# Patient Record
Sex: Male | Born: 1960 | Race: White | Hispanic: No | Marital: Married | Smoking: Never smoker
Health system: Southern US, Community
[De-identification: ages and names within clinical notes are randomized; demographics above are authoritative.]

## PROBLEM LIST (undated history)

## (undated) DIAGNOSIS — I219 Acute myocardial infarction, unspecified: Secondary | ICD-10-CM

## (undated) DIAGNOSIS — E119 Type 2 diabetes mellitus without complications: Secondary | ICD-10-CM

## (undated) DIAGNOSIS — I2510 Atherosclerotic heart disease of native coronary artery without angina pectoris: Secondary | ICD-10-CM

## (undated) HISTORY — PX: APPENDECTOMY: SHX54

## (undated) HISTORY — PX: CORONARY STENT PLACEMENT: SHX1402

## (undated) HISTORY — DX: Atherosclerotic heart disease of native coronary artery without angina pectoris: I25.10

## (undated) HISTORY — PX: BACK SURGERY: SHX140

## (undated) HISTORY — PX: SINUS SURGERY WITH INSTATRAK: SHX5215

## (undated) HISTORY — PX: CARDIAC CATHETERIZATION: SHX172

## (undated) HISTORY — PX: CORONARY ANGIOPLASTY: SHX604

---

## 2002-05-13 ENCOUNTER — Encounter: Admission: RE | Admit: 2002-05-13 | Discharge: 2002-08-11 | Payer: Self-pay | Admitting: Family Medicine

## 2017-03-30 DIAGNOSIS — Z955 Presence of coronary angioplasty implant and graft: Secondary | ICD-10-CM | POA: Insufficient documentation

## 2017-03-31 ENCOUNTER — Encounter: Payer: Self-pay | Admitting: Cardiology

## 2017-03-31 DIAGNOSIS — E119 Type 2 diabetes mellitus without complications: Secondary | ICD-10-CM

## 2017-03-31 DIAGNOSIS — E7800 Pure hypercholesterolemia, unspecified: Secondary | ICD-10-CM

## 2017-03-31 DIAGNOSIS — I2510 Atherosclerotic heart disease of native coronary artery without angina pectoris: Secondary | ICD-10-CM

## 2017-03-31 DIAGNOSIS — I119 Hypertensive heart disease without heart failure: Secondary | ICD-10-CM

## 2017-08-22 ENCOUNTER — Observation Stay (HOSPITAL_COMMUNITY)
Admission: EM | Admit: 2017-08-22 | Discharge: 2017-08-24 | Disposition: A | Discharge status: Home / Self Care | Payer: BLUE CROSS/BLUE SHIELD | Attending: Cardiology | Admitting: Cardiology

## 2017-08-22 ENCOUNTER — Other Ambulatory Visit: Payer: Self-pay

## 2017-08-22 ENCOUNTER — Emergency Department (HOSPITAL_COMMUNITY): Payer: BLUE CROSS/BLUE SHIELD

## 2017-08-22 ENCOUNTER — Encounter: Payer: Self-pay | Admitting: Cardiology

## 2017-08-22 ENCOUNTER — Encounter (HOSPITAL_COMMUNITY): Payer: Self-pay | Admitting: Nurse Practitioner

## 2017-08-22 DIAGNOSIS — Z6834 Body mass index (BMI) 34.0-34.9, adult: Secondary | ICD-10-CM | POA: Insufficient documentation

## 2017-08-22 DIAGNOSIS — R079 Chest pain, unspecified: Secondary | ICD-10-CM | POA: Diagnosis present

## 2017-08-22 DIAGNOSIS — I119 Hypertensive heart disease without heart failure: Secondary | ICD-10-CM | POA: Insufficient documentation

## 2017-08-22 DIAGNOSIS — E785 Hyperlipidemia, unspecified: Secondary | ICD-10-CM | POA: Diagnosis not present

## 2017-08-22 DIAGNOSIS — I25110 Atherosclerotic heart disease of native coronary artery with unstable angina pectoris: Principal | ICD-10-CM | POA: Insufficient documentation

## 2017-08-22 DIAGNOSIS — I200 Unstable angina: Secondary | ICD-10-CM | POA: Diagnosis present

## 2017-08-22 DIAGNOSIS — Z7984 Long term (current) use of oral hypoglycemic drugs: Secondary | ICD-10-CM | POA: Insufficient documentation

## 2017-08-22 DIAGNOSIS — Z955 Presence of coronary angioplasty implant and graft: Secondary | ICD-10-CM | POA: Diagnosis not present

## 2017-08-22 DIAGNOSIS — Z7982 Long term (current) use of aspirin: Secondary | ICD-10-CM | POA: Diagnosis not present

## 2017-08-22 DIAGNOSIS — I252 Old myocardial infarction: Secondary | ICD-10-CM | POA: Diagnosis not present

## 2017-08-22 DIAGNOSIS — E782 Mixed hyperlipidemia: Secondary | ICD-10-CM | POA: Insufficient documentation

## 2017-08-22 DIAGNOSIS — I2510 Atherosclerotic heart disease of native coronary artery without angina pectoris: Secondary | ICD-10-CM | POA: Insufficient documentation

## 2017-08-22 DIAGNOSIS — E119 Type 2 diabetes mellitus without complications: Secondary | ICD-10-CM | POA: Insufficient documentation

## 2017-08-22 DIAGNOSIS — Z794 Long term (current) use of insulin: Secondary | ICD-10-CM | POA: Insufficient documentation

## 2017-08-22 DIAGNOSIS — E669 Obesity, unspecified: Secondary | ICD-10-CM

## 2017-08-22 HISTORY — DX: Acute myocardial infarction, unspecified: I21.9

## 2017-08-22 HISTORY — DX: Type 2 diabetes mellitus without complications: E11.9

## 2017-08-22 LAB — BASIC METABOLIC PANEL
Anion gap: 8 (ref 5–15)
BUN: 16 mg/dL (ref 6–20)
CO2: 24 mmol/L (ref 22–32)
Calcium: 9.6 mg/dL (ref 8.9–10.3)
Chloride: 107 mmol/L (ref 101–111)
Creatinine, Ser: 0.95 mg/dL (ref 0.61–1.24)
GFR calc Af Amer: >60 mL/min (ref >60)
GFR calc non Af Amer: >60 mL/min (ref >60)
Glucose, Bld: 124 mg/dL — ABNORMAL HIGH (ref 65–99)
Potassium: 4 mmol/L (ref 3.5–5.1)
Sodium: 139 mmol/L (ref 135–145)

## 2017-08-22 LAB — I-STAT TROPONIN, ED
Troponin i, poc: 0.03 ng/mL (ref 0.00–0.08)
Troponin i, poc: 0 ng/mL (ref 0.00–0.08)

## 2017-08-22 LAB — CBC
HCT: 40.1 % (ref 39.0–52.0)
Hemoglobin: 12.9 g/dL — ABNORMAL LOW (ref 13.0–17.0)
MCH: 31.2 pg (ref 26.0–34.0)
MCHC: 32.2 g/dL (ref 30.0–36.0)
MCV: 97.1 fL (ref 78.0–100.0)
Platelets: 288 10*3/uL (ref 150–400)
RBC: 4.13 MIL/uL — ABNORMAL LOW (ref 4.22–5.81)
RDW: 13.8 % (ref 11.5–15.5)
WBC: 9 10*3/uL (ref 4.0–10.5)

## 2017-08-22 MED ORDER — ACETAMINOPHEN 325 MG PO TABS
650.0000 mg | ORAL_TABLET | ORAL | Status: DC | PRN
  Administered 2017-08-23: 650 mg via ORAL
  Filled 2017-08-22: qty 2

## 2017-08-22 MED ORDER — LORATADINE 10 MG PO TABS
10.0000 mg | ORAL_TABLET | Freq: Every day | ORAL | Status: DC
  Administered 2017-08-23 – 2017-08-24 (×2): 10 mg via ORAL
  Filled 2017-08-22 (×2): qty 1

## 2017-08-22 MED ORDER — ASPIRIN EC 81 MG PO TBEC
81.0000 mg | DELAYED_RELEASE_TABLET | Freq: Every day | ORAL | Status: DC
  Administered 2017-08-23 – 2017-08-24 (×2): 81 mg via ORAL
  Filled 2017-08-22 (×2): qty 1

## 2017-08-22 MED ORDER — LOSARTAN POTASSIUM 25 MG PO TABS
25.0000 mg | ORAL_TABLET | Freq: Every day | ORAL | Status: DC
  Administered 2017-08-23 – 2017-08-24 (×2): 25 mg via ORAL
  Filled 2017-08-22 (×2): qty 1

## 2017-08-22 MED ORDER — ONDANSETRON HCL 4 MG/2ML IJ SOLN: 4.0000 mg | Freq: Four times a day (QID) | INTRAMUSCULAR | Status: DC | PRN

## 2017-08-22 MED ORDER — VITAMIN D 1000 UNITS PO TABS
2000.0000 [IU] | ORAL_TABLET | Freq: Two times a day (BID) | ORAL | Status: DC
  Administered 2017-08-23 – 2017-08-24 (×3): 2000 [IU] via ORAL
  Filled 2017-08-22 (×3): qty 2

## 2017-08-22 MED ORDER — VITAMIN C 500 MG PO TABS
1000.0000 mg | ORAL_TABLET | Freq: Every day | ORAL | Status: DC
  Administered 2017-08-23 – 2017-08-24 (×2): 1000 mg via ORAL
  Filled 2017-08-22 (×2): qty 2

## 2017-08-22 MED ORDER — ADULT MULTIVITAMIN W/MINERALS CH
1.0000 | ORAL_TABLET | Freq: Every day | ORAL | Status: DC
  Administered 2017-08-23 – 2017-08-24 (×2): 1 via ORAL
  Filled 2017-08-22 (×2): qty 1

## 2017-08-22 NOTE — ED Provider Notes (Signed)
[facility]-EMERGENCY DEPT Provider Note   CSN: [id number] Arrival date & time: 08/22/17  1726     History   Chief Complaint Chief Complaint  Patient presents with  . Chest Pain    HPI Benjamin Riley is a 56 y.o. male with a history of MI, diabetes, who presents today for evaluation of chest pain. He reports that this chest pain beginning this afternoon when he was at his desk. It was a sharp pain that was 9 out of 10 and has left anterior chest. He reports that while he was having the pain he was extremely diaphoretic, and felt like he was going to pass out but did not. He denies any recent fevers, or cough. Denies any recent long travel. He reports that when he had his previous MI his only symptom was a headache and he did not have chest pain.  His cardiologist is Dr.Tilley.    His previous MI was in [place] in 2012.  HPI  Past Medical History:  Diagnosis Date  . Diabetes mellitus without complication (HCC)   . Myocardial infarct [facility])     Patient Active Problem List   Diagnosis Date Noted  . CAD (coronary artery disease), native coronary artery 08/22/2017  . Diabetes mellitus type 2, noninsulin dependent (HCC) 08/22/2017  . Hyperlipidemia 08/22/2017  . Hypertensive heart disease without CHF 08/22/2017  . Unstable angina (HCC) 08/22/2017  . Status post coronary artery stent placement 03/30/2017    Past Surgical History:  Procedure Laterality Date  . BACK SURGERY    . CORONARY STENT PLACEMENT         Home Medications    Prior to Admission medications   Medication Sig Start Date End Date Taking? Authorizing Provider  Ascorbic Acid (VITAMIN C) 1000 MG tablet Take 1,000 mg by mouth daily.   Yes [provider]  aspirin EC 81 MG tablet Take 81 mg by mouth daily.   Yes [provider]  Cholecalciferol (VITAMIN D3) 2000 units capsule Take 2,000 Units by mouth 2 (two) times daily.   Yes [provider]  glipiZIDE (GLUCOTROL) 10 MG tablet Take 10 mg  by mouth 2 (two) times daily. 07/17/17  Yes [provider]  JARDIANCE 10 MG TABS tablet Take 10 mg by mouth daily. 07/10/17  Yes [provider]  LIVALO 2 MG TABS Take 2 mg by mouth daily. 07/10/17  Yes [provider]  loratadine (CLARITIN) 10 MG tablet Take 10 mg by mouth daily.   Yes [provider]  losartan (COZAAR) 25 MG tablet Take 25 mg by mouth daily. 07/20/17  Yes [provider]  metFORMIN (GLUCOPHAGE-XR) 500 MG 24 hr tablet Take 500 mg by mouth 2 (two) times daily. 07/17/17  Yes [provider]  Multiple Vitamin (MULTI-VITAMINS) TABS Take 1 tablet by mouth daily.   Yes [provider]  pioglitazone (ACTOS) 30 MG tablet Take 30 mg by mouth daily. 07/17/17  Yes [provider]    Family History No family history on file.  Social History Social History  Substance Use Topics  . Smoking status: Never Smoker  . Smokeless tobacco: Never Used  . Alcohol use No     Allergies   Statins   Review of Systems Review of Systems  Constitutional: Positive for diaphoresis. Negative for chills and fever.  HENT: Negative for ear pain and sore throat.   Eyes: Negative for pain and visual disturbance.  Respiratory: Positive for shortness of breath. Negative for cough.   Cardiovascular:  Positive for chest pain. Negative for palpitations.  Gastrointestinal: Negative for abdominal pain, nausea and vomiting.  Genitourinary: Negative for dysuria and hematuria.  Musculoskeletal: Negative for arthralgias and back pain.  Skin: Negative for color change and rash.  Neurological: Positive for light-headedness. Negative for seizures, syncope and headaches.  All other systems reviewed and are negative.  All positives except mild headache were present during CP episode, have resolved.   Physical Exam Updated Vital Signs BP 139/75   Pulse (!) 50   Temp 98.3 F (36.8 C) (Oral)   Resp 14   SpO2 97%   Physical Exam    Constitutional: He appears well-developed and well-nourished.  HENT:  Head: Normocephalic and atraumatic.  Eyes: Conjunctivae are normal.  Neck: Neck supple. No JVD present.  Cardiovascular: Normal rate, regular rhythm, normal heart sounds and intact distal pulses.   No murmur heard. Pulmonary/Chest: Effort normal and breath sounds normal. No respiratory distress.  Abdominal: Soft. Bowel sounds are normal. He exhibits no distension. There is no tenderness.  Musculoskeletal: He exhibits no edema.  Neurological: He is alert.  Skin: Skin is warm and dry. He is not diaphoretic.  Psychiatric: He has a normal mood and affect. His behavior is normal.  Nursing note and vitals reviewed.    ED Treatments / Results  Labs (all labs ordered are listed, but only abnormal results are displayed) Labs Reviewed  BASIC METABOLIC PANEL - Abnormal; Notable for the following:       Result Value   Glucose, Bld 124 (*)    All other components within normal limits  CBC - Abnormal; Notable for the following:    RBC 4.13 (*)    Hemoglobin 12.9 (*)    All other components within normal limits  HIV ANTIBODY (ROUTINE TESTING)  TROPONIN I  CBC  BASIC METABOLIC PANEL  I-STAT TROPONIN, ED  I-STAT TROPONIN, ED    EKG  EKG Interpretation  Date/Time:  Tuesday August 22 2017 21:31:28 EDT Ventricular Rate:  45 PR Interval:  152 QRS Duration: 106 QT Interval:  482 QTC Calculation: 417 R Axis:   -12 Text Interpretation:  Sinus bradycardia Left ventricular hypertrophy Confirmed by Benjiman CorePickering, Nathan [phone]([id number]) on 08/22/2017 10:09:45 PM       Radiology Dg Chest 2 View  Result Date: 08/22/2017 CLINICAL DATA:  Chest pain beginning this afternoon while at rest. EXAM: CHEST  2 VIEW COMPARISON:  None. FINDINGS: The heart size and mediastinal contours are within normal limits. Both lungs are clear. Mild degenerative change along the upper and midthoracic spine. IMPRESSION: No active cardiopulmonary disease.  Electronically Signed   By: Tollie Ethavid  Kwon M.D.   On: 08/22/2017 18:42    Procedures Procedures (including critical care time)  Medications Ordered in ED Medications     Initial Impression / Assessment and Plan / ED Course  I have reviewed the triage vital signs and the nursing notes.  Pertinent labs & imaging results that were available during my care of the patient were reviewed by me and considered in my medical decision making (see chart for details).  Clinical Course as of Aug 24 19  Tue Aug 22, 2017  2240 Spoke with cardiology who states that they will admit the patient.  [EH]    Clinical Course User Index [EH] Cristina GongHammond, Cheryle Dark W, PA-C   Concern for cardiac etiology of Chest Pain. Cardiology has been consulted and will see patient in the ED for likely admit. Pt does not meet criteria for CP protocol and a  further evaluation is recommended. Pt has been re-evaluated prior to consult and VSS, NAD, heart RRR, pain 0/10, lungs CTAB. No acute abnormalities found on EKG and first round of cardiac enzymes negative. This case was discussed with Dr. Rubin Payor who has seen the patient and agrees with plan to admit.    Final Clinical Impressions(s) / ED Diagnoses   Final diagnoses:  Chest pain, unspecified type    New Prescriptions New Prescriptions   No medications on file     Norman Clay 08/23/17 Lloyd Huger, MD 08/23/17 0028

## 2017-08-22 NOTE — ED Triage Notes (Signed)
Pt presents with c/o chest pain. The pain began this afternoon while he was sitting at rest. The pain was a sharp pain in his left upper chest that has decreased now. He reports lightheadedness, diaphoresis. He denies syncope, fevers, shortness of breath, cough. He has history of MI six years ago and this feels similar.

## 2017-08-22 NOTE — Progress Notes (Unsigned)
Benjamin Riley, Benjamin Riley    Date of visit:  03/31/2017 DOB:  1961/03/17    Age:  56 yrs. Medical record number:  [id number]     Account number:  [id number] ____________________________ CURRENT DIAGNOSES  1. CAD Native without angina  2. Hypertensive heart disease without heart failure  3. Hyperlipidemia  4. Murmur  5. Presence of coronary stent  6. Type 2 diabetes mellitus without complications  7. Obesity ____________________________ ALLERGIES  Hmg-Coa Reductase Inhibitors, Muscle aches  Lisinopril, Intolerance-cough  Penicillins, Rash ____________________________ MEDICATIONS  1. Aspirin Childrens 81 mg chewable tablet, 1 p.o. daily  2. glipizide 10 mg tablet, BID  3. Januvia 50 mg tablet, 1 p.o. daily  4. loratadine 10 mg tablet, 1 p.o. daily  5. losartan 25 mg tablet, 1 p.o. daily  6. metformin 500 mg tablet, 2 p.o. b.i.d.  7. Zetia 10 mg tablet, 1 p.o. daily ____________________________ CHIEF COMPLAINTS  coronary artery disease ____________________________ HISTORY OF PRESENT ILLNESS This very nice 56 year old male is seen to establish for cardiac care. [place] He has a prior history of hypertension diabetes and hyperlipidemia. He has known statin intolerance. He had some symptoms of dyspnea and vague tightness and had a stent in his LAD placed in [place] in 2012. He had a negative myocardial perfusion scan in June of 2016. He normally takes fairly good care of himself and has established for diabetic care with Dr. Leslie DalesAltheimer. He is obese and has really not been able to lose any significant weight. He does not get much exercise. He does have a known cardiac murmur and at some point may of had an echocardiogram in the past. He doesn't really get a lot in the way of regular exercise. He has no diabetic complications. He denies PND orthopnea or edema.  ____________________________ PAST HISTORY  Past Medical Illnesses:  hypertension,  DM-non-insulin dependent, hyperlipidemia, obesity;  Cardiovascular Illnesses:  CAD, murmur, LVH;  Infectious Diseases:  no previous history of significant infectious diseases;  Surgical Procedures:  appendectomy, lumbar laminectomy, shoulder repair;  Trauma History:  no previous history of significant trauma;  NYHA Classification:  I;  Canadian Angina Classification:  Class 0: Asymptomatic;  Cardiology Procedures-Invasive:  cardiac cath (left) March 2012, 3.5x 15 mm Xience stent to LAD [facility]24/[facility];  Cardiology Procedures-Noninvasive:  treadmill Myoview 2016;  Peripheral Vascular Procedures:  no previous invasive peripheral vascular procedures.;  LVEF of 60% documented via echocardiogram on 05/19/2015,   ____________________________ CARDIO-PULMONARY TEST DATES EKG Date:  03/31/2017;   Cardiac Cath Date:  02/21/2011;  Stent Placement Date: 02/21/2011;  Nuclear Study Date:  02/22/2012;  Echocardiography Date: 05/19/2015;   ____________________________ FAMILY HISTORY Brother -- Coronary Artery Disease, Brother alive with problem Brother -- Brother alive and well Father -- Father alive with problem, Heart disease Mother -- Diabetes mellitus, Cancer ___________________________ SOCIAL HISTORY Alcohol Use:  does not use alcohol;  Smoking:  never smoked;  Diet:  regular diet;  Lifestyle:  married;  Exercise:  exercises regularly;  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 Eyes: denies diplopia, history of glaucoma or visual problems. Ears, Nose, Throat, Mouth:  denies any hearing loss, epistaxis, hoarseness or difficulty speaking. Respiratory: denies dyspnea, cough, wheezing or hemoptysis. Cardiovascular:  please review HPI Abdominal: occasional diarrhea  Genitourinary-Male: no dysuria, urgency, frequency, or nocturia  Musculoskeletal:  chronic low back pain Neurological:   denies  headaches, stroke, or TIA Psychiatric:  denies depression or anxiety Endocrine: denies any history of weight change, heat/cold intolerance, polydipsia, or polyuria Hematological/Immunologic:  denies any food allergies, bleeding disorders. ____________________________ PHYSICAL EXAMINATION VITAL SIGNS  Blood Pressure:  130/70 Sitting, Left arm, large cuff  , 124/72 Standing, Left arm and large cuff   Pulse:  59/min. Weight:  246.00 lbs. Height:  70"BMI: 35  Constitutional:  pleasant white male in no acute distress, moderately obese Skin:  warm and dry to touch, no apparent skin lesions, or masses noted. Head:  normocephalic, normal hair pattern, no masses or tenderness Eyes:  EOMS Intact, PERRLA, C and S clear, Funduscopic exam not done. ENT:  ears, nose and throat reveal no gross abnormalities.  Dentition good. Neck:  supple, without massess. No JVD, thyromegaly or carotid bruits. Carotid upstroke normal. Chest:  normal symmetry, clear to auscultation. Cardiac:  regular rhythm, normal S1 and S2, No S3 or S4, no murmurs, gallops or rubs detected. Abdomen:  abdomen soft,non-tender, no masses, no hepatospenomegaly, or aneurysm noted Peripheral Pulses:  the femoral,dorsalis pedis, and posterior tibial pulses are full and equal bilaterally with no bruits auscultated. Extremities & Back:  no deformities, clubbing, cyanosis, erythema or edema observed. Normal muscle strength and tone. Neurological:  no gross motor or sensory deficits noted, affect appropriate, oriented x3. ____________________________ IMPRESSIONS/PLAN  1. CAD with previous stenting of the LAD with no angina 2. Hypertensive heart disease controlled 3. Obesity with the need to lose weight discussed with patient 4. Hyperlipidemia with statin intolerance 5. Type 2 diabetes without complications currently managed medically  Recommendations:   Discuss cardiac prevention with the patient. Discussed need for weight reduction  and regular exercise. His previous cardiologist mentioned changing to the PCSK9 inhibitor's. His endocrinologist has started him on Livalo but I doubt that this will get him to where he needs to be for his cardiac status. He has been intolerant to all of the other statins and I believe if his endocrinologist does not start the PCSK9 inhibitors I have asked him to contact us if his lipids do not come under control. Otherwise I will see him in followup in one year. 12 EKG personally reviewed by me shows sinus rhythm and LVH by voltage.  ____________________________ TODAYS ORDERS  1. 12 Lead EKG: 1 year  2. 12 Lead EKG: Today  3. Return Visit: 1 year                       ____________________________ Cardiology Physician:  W. Spencer Tehilla Coffel, Jr. MD FACC    

## 2017-08-22 NOTE — Progress Notes (Signed)
Benjamin Riley, [name]    Date of visit:  03/31/2017 DOB:  1961/03/17    Age:  56 yrs. Medical record number:  [id number]     Account number:  [id number] ____________________________ CURRENT DIAGNOSES  1. CAD Native without angina  2. Hypertensive heart disease without heart failure  3. Hyperlipidemia  4. Murmur  5. Presence of coronary stent  6. Type 2 diabetes mellitus without complications  7. Obesity ____________________________ ALLERGIES  Hmg-Coa Reductase Inhibitors, Muscle aches  Lisinopril, Intolerance-cough  Penicillins, Rash ____________________________ MEDICATIONS  1. Aspirin Childrens 81 mg chewable tablet, 1 p.o. daily  2. glipizide 10 mg tablet, BID  3. Januvia 50 mg tablet, 1 p.o. daily  4. loratadine 10 mg tablet, 1 p.o. daily  5. losartan 25 mg tablet, 1 p.o. daily  6. metformin 500 mg tablet, 2 p.o. b.i.d.  7. Zetia 10 mg tablet, 1 p.o. daily ____________________________ CHIEF COMPLAINTS  coronary artery disease ____________________________ HISTORY OF PRESENT ILLNESS This very nice 56 year old male is seen to establish for cardiac care. The patient recently [place] [facility]. He has a prior history of hypertension diabetes and hyperlipidemia. He has known statin intolerance. He had some symptoms of dyspnea and vague tightness and had a stent in his LAD placed in [place] in [place]. He had a negative myocardial perfusion scan in June of 2016. He normally takes fairly good care of himself and has established for diabetic care with Dr. Leslie DalesAltheimer. He is obese and has really not been able to lose any significant weight. He does not get much exercise. He does have a known cardiac murmur and at some point may of had an echocardiogram in the past. He doesn't really get a lot in the way of regular exercise. He has no diabetic complications. He denies PND orthopnea or edema.  ____________________________ PAST HISTORY  Past Medical Illnesses:  hypertension,  DM-non-insulin dependent, hyperlipidemia, obesity;  Cardiovascular Illnesses:  CAD, murmur, LVH;  Infectious Diseases:  no previous history of significant infectious diseases;  Surgical Procedures:  appendectomy, lumbar laminectomy, shoulder repair;  Trauma History:  no previous history of significant trauma;  NYHA Classification:  I;  Canadian Angina Classification:  Class 0: Asymptomatic;  Cardiology Procedures-Invasive:  cardiac cath (left) March 2012, 3.5x 15 mm Xience stent to LAD 02/24/11 [facility];  Cardiology Procedures-Noninvasive:  treadmill Myoview 2016;  Peripheral Vascular Procedures:  no previous invasive peripheral vascular procedures.;  LVEF of 60% documented via echocardiogram on 05/19/2015,   ____________________________ CARDIO-PULMONARY TEST DATES EKG Date:  03/31/2017;   Cardiac Cath Date:  02/21/2011;  Stent Placement Date: 02/21/2011;  Nuclear Study Date:  02/22/2012;  Echocardiography Date: 05/19/2015;   ____________________________ FAMILY HISTORY Brother -- Coronary Artery Disease, Brother alive with problem Brother -- Brother alive and well Father -- Father alive with problem, Heart disease Mother -- Diabetes mellitus, Cancer ___________________________ SOCIAL HISTORY Alcohol Use:  does not use alcohol;  Smoking:  never smoked;  Diet:  regular diet;  Lifestyle:  married;  Exercise:  exercises regularly;  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 Eyes: denies diplopia, history of glaucoma or visual problems. Ears, Nose, Throat, Mouth:  denies any hearing loss, epistaxis, hoarseness or difficulty speaking. Respiratory: denies dyspnea, cough, wheezing or hemoptysis. Cardiovascular:  please review HPI Abdominal: occasional diarrhea  Genitourinary-Male: no dysuria, urgency, frequency, or nocturia  Musculoskeletal:  chronic low back pain Neurological:   denies  headaches, stroke, or TIA Psychiatric:  denies depression or anxiety Endocrine: denies any history of weight change, heat/cold intolerance, polydipsia, or polyuria Hematological/Immunologic:  denies any food allergies, bleeding disorders. ____________________________ PHYSICAL EXAMINATION VITAL SIGNS  Blood Pressure:  130/70 Sitting, Left arm, large cuff  , 124/72 Standing, Left arm and large cuff   Pulse:  59/min. Weight:  246.00 lbs. Height:  70"BMI: 35  Constitutional:  pleasant white male in no acute distress, moderately obese Skin:  warm and dry to touch, no apparent skin lesions, or masses noted. Head:  normocephalic, normal hair pattern, no masses or tenderness Eyes:  EOMS Intact, PERRLA, C and S clear, Funduscopic exam not done. ENT:  ears, nose and throat reveal no gross abnormalities.  Dentition good. Neck:  supple, without massess. No JVD, thyromegaly or carotid bruits. Carotid upstroke normal. Chest:  normal symmetry, clear to auscultation. Cardiac:  regular rhythm, normal S1 and S2, No S3 or S4, no murmurs, gallops or rubs detected. Abdomen:  abdomen soft,non-tender, no masses, no hepatospenomegaly, or aneurysm noted Peripheral Pulses:  the femoral,dorsalis pedis, and posterior tibial pulses are full and equal bilaterally with no bruits auscultated. Extremities & Back:  no deformities, clubbing, cyanosis, erythema or edema observed. Normal muscle strength and tone. Neurological:  no gross motor or sensory deficits noted, affect appropriate, oriented x3. ____________________________ IMPRESSIONS/PLAN  1. CAD with previous stenting of the LAD with no angina 2. Hypertensive heart disease controlled 3. Obesity with the need to lose weight discussed with patient 4. Hyperlipidemia with statin intolerance 5. Type 2 diabetes without complications currently managed medically  Recommendations:   Discuss cardiac prevention with the patient. Discussed need for weight reduction  and regular exercise. His previous cardiologist mentioned changing to the PCSK9 inhibitor's. His endocrinologist has started him on Livalo but I doubt that this will get him to where he needs to be for his cardiac status. He has been intolerant to all of the other statins and I believe if his endocrinologist does not start the PCSK9 inhibitors I have asked him to contact us if his lipids do not come under control. Otherwise I will see him in followup in one year. 12 EKG personally reviewed by me shows sinus rhythm and LVH by voltage.  ____________________________ TODAYS ORDERS  1. 12 Lead EKG: 1 year  2. 12 Lead EKG: Today  3. Return Visit: 1 year                       ____________________________ Cardiology Physician:  W. Spencer Tilley, Jr. MD FACC    

## 2017-08-22 NOTE — Progress Notes (Deleted)
Benjamin Riley, [name]    Date of visit:  03/31/2017 DOB:  1961/03/17    Age:  56 yrs. Medical record number:  [id number]     Account number:  [id number] ____________________________ CURRENT DIAGNOSES  1. CAD Native without angina  2. Hypertensive heart disease without heart failure  3. Hyperlipidemia  4. Murmur  5. Presence of coronary stent  6. Type 2 diabetes mellitus without complications  7. Obesity ____________________________ ALLERGIES  Hmg-Coa Reductase Inhibitors, Muscle aches  Lisinopril, Intolerance-cough  Penicillins, Rash ____________________________ MEDICATIONS  1. Aspirin Childrens 81 mg chewable tablet, 1 p.o. daily  2. glipizide 10 mg tablet, BID  3. Januvia 50 mg tablet, 1 p.o. daily  4. loratadine 10 mg tablet, 1 p.o. daily  5. losartan 25 mg tablet, 1 p.o. daily  6. metformin 500 mg tablet, 2 p.o. b.i.d.  7. Zetia 10 mg tablet, 1 p.o. daily ____________________________ CHIEF COMPLAINTS  coronary artery disease ____________________________ HISTORY OF PRESENT ILLNESS This very nice 56 year old male is seen to establish for cardiac care. [place]. He has a prior history of hypertension diabetes and hyperlipidemia. He has known statin intolerance. He had some symptoms of dyspnea and vague tightness and had a stent in his LAD placed in [place] in 2012. He had a negative myocardial perfusion scan in June of 2016. He normally takes fairly good care of himself and has established for diabetic care with Dr. Leslie DalesAltheimer. He is obese and has really not been able to lose any significant weight. He does not get much exercise. He does have a known cardiac murmur and at some point may of had an echocardiogram in the past. He doesn't really get a lot in the way of regular exercise. He has no diabetic complications. He denies PND orthopnea or edema.  ____________________________ PAST HISTORY  Past Medical Illnesses:  hypertension,  DM-non-insulin dependent, hyperlipidemia, obesity;  Cardiovascular Illnesses:  CAD, murmur, LVH;  Infectious Diseases:  no previous history of significant infectious diseases;  Surgical Procedures:  appendectomy, lumbar laminectomy, shoulder repair;  Trauma History:  no previous history of significant trauma;  NYHA Classification:  I;  Canadian Angina Classification:  Class 0: Asymptomatic;  Cardiology Procedures-Invasive:  cardiac cath (left) March 2012, 3.5x 15 mm Xience stent to LAD [place] [facility];  Cardiology Procedures-Noninvasive:  treadmill Myoview 2016;  Peripheral Vascular Procedures:  no previous invasive peripheral vascular procedures.;  LVEF of 60% documented via echocardiogram on 05/19/2015,   ____________________________ CARDIO-PULMONARY TEST DATES EKG Date:  03/31/2017;   Cardiac Cath Date:  02/21/2011;  Stent Placement Date: 02/21/2011;  Nuclear Study Date:  02/22/2012;  Echocardiography Date: 05/19/2015;   ____________________________ FAMILY HISTORY Brother -- Coronary Artery Disease, Brother alive with problem Brother -- Brother alive and well Father -- Father alive with problem, Heart disease Mother -- Diabetes mellitus, Cancer ___________________________ SOCIAL HISTORY Alcohol Use:  does not use alcohol;  Smoking:  never smoked;  Diet:  regular diet;  Lifestyle:  married;  Exercise:  exercises regularly;  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 Eyes: denies diplopia, history of glaucoma or visual problems. Ears, Nose, Throat, Mouth:  denies any hearing loss, epistaxis, hoarseness or difficulty speaking. Respiratory: denies dyspnea, cough, wheezing or hemoptysis. Cardiovascular:  please review HPI Abdominal: occasional diarrhea  Genitourinary-Male: no dysuria, urgency, frequency, or nocturia  Musculoskeletal:  chronic low back pain Neurological:   denies  headaches, stroke, or TIA Psychiatric:  denies depression or anxiety Endocrine: denies any history of weight change, heat/cold intolerance, polydipsia, or polyuria Hematological/Immunologic:  denies any food allergies, bleeding disorders. ____________________________ PHYSICAL EXAMINATION VITAL SIGNS  Blood Pressure:  130/70 Sitting, Left arm, large cuff  , 124/72 Standing, Left arm and large cuff   Pulse:  59/min. Weight:  246.00 lbs. Height:  70"BMI: 35  Constitutional:  pleasant white male in no acute distress, moderately obese Skin:  warm and dry to touch, no apparent skin lesions, or masses noted. Head:  normocephalic, normal hair pattern, no masses or tenderness Eyes:  EOMS Intact, PERRLA, C and S clear, Funduscopic exam not done. ENT:  ears, nose and throat reveal no gross abnormalities.  Dentition good. Neck:  supple, without massess. No JVD, thyromegaly or carotid bruits. Carotid upstroke normal. Chest:  normal symmetry, clear to auscultation. Cardiac:  regular rhythm, normal S1 and S2, No S3 or S4, no murmurs, gallops or rubs detected. Abdomen:  abdomen soft,non-tender, no masses, no hepatospenomegaly, or aneurysm noted Peripheral Pulses:  the femoral,dorsalis pedis, and posterior tibial pulses are full and equal bilaterally with no bruits auscultated. Extremities & Back:  no deformities, clubbing, cyanosis, erythema or edema observed. Normal muscle strength and tone. Neurological:  no gross motor or sensory deficits noted, affect appropriate, oriented x3. ____________________________ IMPRESSIONS/PLAN  1. CAD with previous stenting of the LAD with no angina 2. Hypertensive heart disease controlled 3. Obesity with the need to lose weight discussed with patient 4. Hyperlipidemia with statin intolerance 5. Type 2 diabetes without complications currently managed medically  Recommendations:   Discuss cardiac prevention with the patient. Discussed need for weight reduction  and regular exercise. His previous cardiologist mentioned changing to the PCSK9 inhibitor's. His endocrinologist has started him on Livalo but I doubt that this will get him to where he needs to be for his cardiac status. He has been intolerant to all of the other statins and I believe if his endocrinologist does not start the PCSK9 inhibitors I have asked him to contact us if his lipids do not come under control. Otherwise I will see him in followup in one year. 12 EKG personally reviewed by me shows sinus rhythm and LVH by voltage.  ____________________________ TODAYS ORDERS  1. 12 Lead EKG: 1 year  2. 12 Lead EKG: Today  3. Return Visit: 1 year                       ____________________________ Cardiology Physician:  Darden Palmer MD [facility]

## 2017-08-22 NOTE — H&P (Addendum)
Cardiology History & Physical    Patient ID: Benjamin HummerBert Ketcham MRN: [id number], DOB: 10-01-1961 Date of Encounter: 08/22/2017, 11:24 PM Primary Physician: Hali MarryAltheimer, Michael, MD Primary Cardiologist: Dr. Donnie Ahoilley  Chief Complaint: Chest pain  HPI: Benjamin Riley is a 56 y.o. male with history of MI s/p LAD stenting (2012), DM2 who presents with CP.  Pt was doing well until this afternoon, when he noted the onset of sharp SSCP at rest, with some radiation towards the L arm.  He had significant associated diaphoresis, but denied SOB, palpitations or syncope. The episode lasted approximately one hour and resolved while in the ED waiting room, without any specific intervention.  In the ED, ECG showed no acute changes and pt has been CP free.  Troponins were negative x 2.  He was ultimately admitted for further work up due to his concerning CP history.  Past Medical History:  Diagnosis Date  . Diabetes mellitus without complication (HCC)   . Myocardial infarct [facility](HCC)      Surgical History:  Past Surgical History:  Procedure Laterality Date  . BACK SURGERY    . CORONARY STENT PLACEMENT       Home Meds: Prior to Admission medications   Medication Sig Start Date End Date Taking? Authorizing Provider  Ascorbic Acid (VITAMIN C) 1000 MG tablet Take 1,000 mg by mouth daily.   Yes [provider]  aspirin EC 81 MG tablet Take 81 mg by mouth daily.   Yes [provider]  Cholecalciferol (VITAMIN D3) 2000 units capsule Take 2,000 Units by mouth 2 (two) times daily.   Yes [provider]  glipiZIDE (GLUCOTROL) 10 MG tablet Take 10 mg by mouth 2 (two) times daily. 07/17/17  Yes [provider]  JARDIANCE 10 MG TABS tablet Take 10 mg by mouth daily. 07/10/17  Yes [provider]  LIVALO 2 MG TABS Take 2 mg by mouth daily. 07/10/17  Yes [provider]  loratadine (CLARITIN) 10 MG tablet Take 10 mg by mouth daily.   Yes [provider]  losartan  (COZAAR) 25 MG tablet Take 25 mg by mouth daily. 07/20/17  Yes [provider]  metFORMIN (GLUCOPHAGE-XR) 500 MG 24 hr tablet Take 500 mg by mouth 2 (two) times daily. 07/17/17  Yes [provider]  Multiple Vitamin (MULTI-VITAMINS) TABS Take 1 tablet by mouth daily.   Yes [provider]  pioglitazone (ACTOS) 30 MG tablet Take 30 mg by mouth daily. 07/17/17  Yes [provider]    Allergies:  Allergies  Allergen Reactions  . Statins Other (See Comments)    Leg cramps with simvastatin and atorvastatin     Social History   Social History  . Marital status: Married    Spouse name: N/A  . Number of children: N/A  . Years of education: N/A   Occupational History  . Not on file.   Social History Main Topics  . Smoking status: Never Smoker  . Smokeless tobacco: Never Used  . Alcohol use No  . Drug use: No  . Sexual activity: Not on file   Other Topics Concern  . Not on file   Social History Narrative  . No narrative on file    Works in [employer], recently moved here from [place].  No family history on file.  Review of Systems General: negative for chills, fever, night sweats or weight changes.  Cardiovascular: negative for edema, orthopnea, palpitations, paroxysmal nocturnal dyspnea, shortness of breath or dyspnea on exertion  Dermatological: negative for rash Respiratory: negative for cough or wheezing Urologic: negative for hematuria Abdominal: negative for nausea, vomiting, diarrhea, bright red blood per rectum, melena, or hematemesis Neurologic: negative for visual changes, syncope, or dizziness All other systems reviewed and are otherwise negative except as noted above.  Labs:   Lab Results  Component Value Date   WBC 9.0 08/22/2017   HGB 12.9 (L) 08/22/2017   HCT 40.1 08/22/2017   MCV 97.1 08/22/2017   PLT 288 08/22/2017    Recent Labs Lab 08/22/17 1737  NA 139  K 4.0  CL 107  CO2 24  BUN 16  CREATININE 0.95  CALCIUM  9.6  GLUCOSE 124*   No results for input(s): CKTOTAL, CKMB, TROPONINI in the last 72 hours. No results found for: CHOL, HDL, LDLCALC, TRIG No results found for: DDIMER  Radiology/Studies:  Dg Chest 2 View  Result Date: 08/22/2017 CLINICAL DATA:  Chest pain beginning this afternoon while at rest. EXAM: CHEST  2 VIEW COMPARISON:  None. FINDINGS: The heart size and mediastinal contours are within normal limits. Both lungs are clear. Mild degenerative change along the upper and midthoracic spine. IMPRESSION: No active cardiopulmonary disease. Electronically Signed   By: Tollie Eth M.D.   On: 08/22/2017 18:42   Wt Readings from Last 3 Encounters:  No data found for Wt    EKG: Sinus bradycardia, LVH.  Physical Exam: Blood pressure 119/62, pulse (!) 49, temperature 98.3 F (36.8 C), temperature source Oral, resp. rate 15, SpO2 97 %. There is no height or weight on file to calculate BMI. General: Well developed, well nourished, in no acute distress. Head: Normocephalic, atraumatic, sclera non-icteric, no xanthomas, nares are without discharge.  Neck: Negative for carotid bruits. JVD not elevated. Lungs: Clear bilaterally to auscultation without wheezes, rales, or rhonchi. Breathing is unlabored. Heart: Bradycardic, regular with normal S1 S2. No murmurs, rubs, or gallops appreciated. Abdomen: Soft, non-tender, non-distended with normoactive bowel sounds. No hepatomegaly. No rebound/guarding. No obvious abdominal masses. Msk:  Strength and tone appear normal for age. Extremities: No clubbing or cyanosis. No edema.  Distal pedal pulses are 2+ and equal bilaterally. Neuro: Alert and oriented X 3. No focal deficit. No facial asymmetry. Moves all extremities spontaneously. Psych:  Responds to questions appropriately with a normal affect.    Assessment and Plan   56 y.o. male with history of MI s/p LAD stenting (2012), DM2 who presents with CP concerning for unstable angina.  1.  CP:  Troponin  negative x 2, currently CP free.  Will continue to cycle enzymes, and consider stress vs. Cath in AM.  Will maintain low threshold to heparinize and move towards cath if enzymes turn positive, or if pt has recurrent CP with dynamic ECG changes.  Continue home ASA, losartan.  Pt previously statin intolerant, currently on low dose Livalo.  Would consider PCSK9 inhibition down the road.  2.  DM2:  Holding oral agents, AISS if needed.  Signed, Esmond Plants, MD 08/22/2017, 11:24 PM

## 2017-08-23 ENCOUNTER — Encounter (HOSPITAL_COMMUNITY): Payer: Self-pay | Admitting: *Deleted

## 2017-08-23 DIAGNOSIS — E669 Obesity, unspecified: Secondary | ICD-10-CM

## 2017-08-23 LAB — CBC
HCT: 38.4 % — ABNORMAL LOW (ref 39.0–52.0)
Hemoglobin: 12.4 g/dL — ABNORMAL LOW (ref 13.0–17.0)
MCH: 31.2 pg (ref 26.0–34.0)
MCHC: 32.3 g/dL (ref 30.0–36.0)
MCV: 96.5 fL (ref 78.0–100.0)
Platelets: 271 10*3/uL (ref 150–400)
RBC: 3.98 MIL/uL — ABNORMAL LOW (ref 4.22–5.81)
RDW: 13.6 % (ref 11.5–15.5)
WBC: 8.3 10*3/uL (ref 4.0–10.5)

## 2017-08-23 LAB — HIV ANTIBODY (ROUTINE TESTING W REFLEX): HIV Screen 4th Generation wRfx: NONREACTIVE

## 2017-08-23 LAB — BASIC METABOLIC PANEL
Anion gap: 6 (ref 5–15)
BUN: 14 mg/dL (ref 6–20)
CO2: 26 mmol/L (ref 22–32)
Calcium: 8.8 mg/dL — ABNORMAL LOW (ref 8.9–10.3)
Chloride: 107 mmol/L (ref 101–111)
Creatinine, Ser: 0.93 mg/dL (ref 0.61–1.24)
GFR calc Af Amer: >60 mL/min (ref >60)
GFR calc non Af Amer: >60 mL/min (ref >60)
Glucose, Bld: 148 mg/dL — ABNORMAL HIGH (ref 65–99)
Potassium: 4 mmol/L (ref 3.5–5.1)
Sodium: 139 mmol/L (ref 135–145)

## 2017-08-23 LAB — GLUCOSE, CAPILLARY
Glucose-Capillary: 110 mg/dL — ABNORMAL HIGH (ref 65–99)
Glucose-Capillary: 115 mg/dL — ABNORMAL HIGH (ref 65–99)

## 2017-08-23 LAB — PROTIME-INR
INR: 0.95
Prothrombin Time: 12.6 seconds (ref 11.4–15.2)

## 2017-08-23 LAB — TROPONIN I: Troponin I: <0.03 ng/mL (ref <0.03)

## 2017-08-23 MED ORDER — SODIUM CHLORIDE 0.9 % WEIGHT BASED INFUSION
3.0000 mL/kg/h | INTRAVENOUS | Status: AC
  Administered 2017-08-24: 3 mL/kg/h via INTRAVENOUS

## 2017-08-23 MED ORDER — SODIUM CHLORIDE 0.9 % IV SOLN
250.0000 mL | INTRAVENOUS | Status: DC | PRN
  Administered 2017-08-23: 250 mL via INTRAVENOUS

## 2017-08-23 MED ORDER — SODIUM CHLORIDE 0.9 % WEIGHT BASED INFUSION
1.0000 mL/kg/h | INTRAVENOUS | Status: DC
  Administered 2017-08-24: 1 mL/kg/h via INTRAVENOUS

## 2017-08-23 MED ORDER — SODIUM CHLORIDE 0.9% FLUSH: 3.0000 mL | INTRAVENOUS | Status: DC | PRN

## 2017-08-23 MED ORDER — ASPIRIN 81 MG PO CHEW: 81.0000 mg | CHEWABLE_TABLET | ORAL | Status: DC

## 2017-08-23 MED ORDER — SODIUM CHLORIDE 0.9% FLUSH
3.0000 mL | Freq: Two times a day (BID) | INTRAVENOUS | Status: DC
  Administered 2017-08-23 – 2017-08-24 (×3): 3 mL via INTRAVENOUS

## 2017-08-23 NOTE — Progress Notes (Signed)
Subjective:  Very nice 56 year old male recently moved to the area I saw him in April.  He has been able to tolerate low dose Livalo for hyperlipidemia but does have statin intolerance.  He had prolonged chest discomfort at work and left upper chest accompanied with nausea and diaphoresis that lasted about an hour and a half.  He has had no further chest discomfort since then.  His enzymes are negative.  His EKG shows LVH.  When he had his previous stent placed in 2012 he noted that he had a severe headache and vague left upper chest pain but not as severe as the current discomfort.  Previous stent was placed in the LAD and was a 3.5 x 15 mm Xience in the proximal LAD.    Objective:  Vital Signs in the last 24 hours: BP 126/70 (BP Location: Right Arm)   Pulse (!) 53   Temp 98.3 F (36.8 C) (Oral)   Resp 18   Ht 5\' 10"  (1.778 m)   Wt 109.7 kg (241 lb 14.4 oz)   SpO2 96%   BMI 34.71 kg/m   Physical Exam:  pleasant mildly obese male in no acute distress  Lungs:  Clear Cardiac:  Regular rhythm, normal S1 and S2, no S3 Abdomen:  Soft, nontender, no masses Extremities:  No edema present, radial pulses present bilaterally  Intake/Output from previous day: No intake/output data recorded.  Weight Filed Weights   08/23/17 0231  Weight: 109.7 kg (241 lb 14.4 oz)    Lab Results: Basic Metabolic Panel:  Recent Labs  86/57/8408/10/29 1737 08/23/17 0525  NA 139 139  K 4.0 4.0  CL 107 107  CO2 24 26  GLUCOSE 124* 148*  BUN 16 14  CREATININE 0.95 0.93   CBC:  Recent Labs  08/22/17 1737 08/23/17 0525  WBC 9.0 8.3  HGB 12.9* 12.4*  HCT 40.1 38.4*  MCV 97.1 96.5  PLT 288 271   Cardiac Enzymes: Troponin (Point of Care Test)  Recent Labs  08/22/17 2136  TROPIPOC 0.00   Cardiac Panel (last 3 results)  Recent Labs  08/23/17 0525  TROPONINI <0.03    Telemetry: Sinus rhythm  Assessment/Plan:   1.  Prolonged chest discomfort suggestive of unstable angina patient with  known coronary artery disease 2.  Diabetes mellitus 3.  Hypertension 4.  Hyperlipidemia with prior statin intolerance but able to tolerate low-dose labile oh her graft recommendations:  He had prolonged chest discomfort and recommended that he undergo catheterization to assess patency of previous stent and evaluate for CAD.  Discomfort is occurring at rest.   Cardiac catheterization was discussed with the patient fully including risks of myocardial infarction, death, stroke, bleeding, arrhythmia, dye allergy, renal insufficiency or bleeding.  The patient understands and is willing to proceed.  Possibility of intervention at the same time also discussed with patient and wife and they understand and are agreeable to proceed     W. Ashley RoyaltySpencer Lakynn Halvorsen, Jr.  MD [facility]  08/23/2017, 9:05 AM

## 2017-08-23 NOTE — Plan of Care (Signed)
Problem: Safety: Goal: Ability to remain free from injury will improve Outcome: Progressing Patient educated on call light system. Verbalizes understanding of need to call for assistance prior to ambulation when necessary

## 2017-08-24 ENCOUNTER — Encounter (HOSPITAL_COMMUNITY)
Admission: EM | Disposition: A | Discharge status: Home / Self Care | Payer: Self-pay | Source: Home / Self Care | Attending: Emergency Medicine

## 2017-08-24 ENCOUNTER — Encounter (HOSPITAL_COMMUNITY): Payer: Self-pay | Admitting: Cardiovascular Disease

## 2017-08-24 DIAGNOSIS — I25110 Atherosclerotic heart disease of native coronary artery with unstable angina pectoris: Secondary | ICD-10-CM

## 2017-08-24 HISTORY — PX: LEFT HEART CATH AND CORONARY ANGIOGRAPHY: CATH118249

## 2017-08-24 SURGERY — LEFT HEART CATH AND CORONARY ANGIOGRAPHY
Anesthesia: LOCAL

## 2017-08-24 MED ORDER — NITROGLYCERIN 0.4 MG SL SUBL: 0.4000 mg | SUBLINGUAL_TABLET | SUBLINGUAL | 12 refills | Status: AC | PRN

## 2017-08-24 MED ORDER — HEPARIN SODIUM (PORCINE) 1000 UNIT/ML IJ SOLN
INTRAMUSCULAR | Status: AC
  Filled 2017-08-24: qty 1

## 2017-08-24 MED ORDER — VERAPAMIL HCL 2.5 MG/ML IV SOLN
INTRAVENOUS | Status: DC | PRN
  Administered 2017-08-24: 10 mL via INTRA_ARTERIAL

## 2017-08-24 MED ORDER — LIDOCAINE HCL (PF) 1 % IJ SOLN
INTRAMUSCULAR | Status: AC
  Filled 2017-08-24: qty 30

## 2017-08-24 MED ORDER — SODIUM CHLORIDE 0.9% FLUSH: 3.0000 mL | Freq: Two times a day (BID) | INTRAVENOUS | Status: DC

## 2017-08-24 MED ORDER — IOPAMIDOL (ISOVUE-370) INJECTION 76%
INTRAVENOUS | Status: DC | PRN
  Administered 2017-08-24: 60 mL via INTRAVENOUS

## 2017-08-24 MED ORDER — HEPARIN (PORCINE) IN NACL 2-0.9 UNIT/ML-% IJ SOLN
INTRAMUSCULAR | Status: AC
  Filled 2017-08-24: qty 1000

## 2017-08-24 MED ORDER — NITROGLYCERIN 0.4 MG SL SUBL: 0.4000 mg | SUBLINGUAL_TABLET | SUBLINGUAL | Status: DC | PRN

## 2017-08-24 MED ORDER — VERAPAMIL HCL 2.5 MG/ML IV SOLN
INTRAVENOUS | Status: AC
  Filled 2017-08-24: qty 2

## 2017-08-24 MED ORDER — IOPAMIDOL (ISOVUE-370) INJECTION 76%
INTRAVENOUS | Status: AC
  Filled 2017-08-24: qty 100

## 2017-08-24 MED ORDER — LIDOCAINE HCL (PF) 1 % IJ SOLN
INTRAMUSCULAR | Status: DC | PRN
  Administered 2017-08-24: 2 mL

## 2017-08-24 MED ORDER — HEPARIN (PORCINE) IN NACL 2-0.9 UNIT/ML-% IJ SOLN
INTRAMUSCULAR | Status: AC | PRN
Start: 2017-08-24 — End: 2017-08-24
  Administered 2017-08-24: 1000 mL

## 2017-08-24 MED ORDER — METOPROLOL SUCCINATE ER 25 MG PO TB24: 25.0000 mg | ORAL_TABLET | Freq: Every day | ORAL | Status: DC

## 2017-08-24 MED ORDER — HEPARIN SODIUM (PORCINE) 1000 UNIT/ML IJ SOLN
INTRAMUSCULAR | Status: DC | PRN
  Administered 2017-08-24: 5000 [IU] via INTRAVENOUS

## 2017-08-24 MED ORDER — SODIUM CHLORIDE 0.9 % IV SOLN: 250.0000 mL | INTRAVENOUS | Status: DC | PRN

## 2017-08-24 MED ORDER — IOPAMIDOL (ISOVUE-370) INJECTION 76%
INTRAVENOUS | Status: AC
  Filled 2017-08-24: qty 50

## 2017-08-24 MED ORDER — FENTANYL CITRATE (PF) 100 MCG/2ML IJ SOLN
INTRAMUSCULAR | Status: DC | PRN
  Administered 2017-08-24: 25 ug via INTRAVENOUS

## 2017-08-24 MED ORDER — MIDAZOLAM HCL 2 MG/2ML IJ SOLN
INTRAMUSCULAR | Status: DC | PRN
  Administered 2017-08-24 (×2): 1 mg via INTRAVENOUS

## 2017-08-24 MED ORDER — METOPROLOL SUCCINATE ER 25 MG PO TB24: 25.0000 mg | ORAL_TABLET | Freq: Every day | ORAL | 12 refills | Status: DC

## 2017-08-24 MED ORDER — MIDAZOLAM HCL 2 MG/2ML IJ SOLN
INTRAMUSCULAR | Status: AC
  Filled 2017-08-24: qty 2

## 2017-08-24 MED ORDER — FENTANYL CITRATE (PF) 100 MCG/2ML IJ SOLN
INTRAMUSCULAR | Status: AC
  Filled 2017-08-24: qty 2

## 2017-08-24 MED ORDER — SODIUM CHLORIDE 0.9% FLUSH: 3.0000 mL | INTRAVENOUS | Status: DC | PRN

## 2017-08-24 MED ORDER — SODIUM CHLORIDE 0.9 % WEIGHT BASED INFUSION
1.0000 mL/kg/h | INTRAVENOUS | Status: DC
  Administered 2017-08-24: 1 mL/kg/h via INTRAVENOUS

## 2017-08-24 SURGICAL SUPPLY — 12 items
CATH 5FR JL3.5 JR4 ANG PIG MP (CATHETERS) ×1 IMPLANT
CATH INFINITI 5FR AL1 (CATHETERS) ×1 IMPLANT
CATH INFINITI 5FR JL4 (CATHETERS) ×1 IMPLANT
DEVICE RAD COMP TR BAND LRG (VASCULAR PRODUCTS) ×1 IMPLANT
GLIDESHEATH SLEND SS 6F .021 (SHEATH) ×1 IMPLANT
GUIDEWIRE INQWIRE 1.5J.035X260 (WIRE) IMPLANT
INQWIRE 1.5J .035X260CM (WIRE) ×2
KIT HEART LEFT (KITS) ×2 IMPLANT
PACK CARDIAC CATHETERIZATION (CUSTOM PROCEDURE TRAY) ×2 IMPLANT
SYR MEDRAD MARK V 150ML (SYRINGE) ×2 IMPLANT
TRANSDUCER W/STOPCOCK (MISCELLANEOUS) ×2 IMPLANT
TUBING CIL FLEX 10 FLL-RA (TUBING) ×2 IMPLANT

## 2017-08-24 NOTE — Interval H&P Note (Signed)
Cath Lab Visit (complete for each Cath Lab visit)  Clinical Evaluation Leading to the Procedure:   ACS: Yes.    Non-ACS:    Anginal Classification: CCS IV  Anti-ischemic medical therapy: No Therapy  Non-Invasive Test Results: No non-invasive testing performed  Prior CABG: No previous CABG      History and Physical Interval Note:  08/24/2017 10:08 AM  Benjamin Riley  has presented today for surgery, with the diagnosis of cp  The various methods of treatment have been discussed with the patient and family. After consideration of risks, benefits and other options for treatment, the patient has consented to  Procedure(s): LEFT HEART CATH AND CORONARY ANGIOGRAPHY (N/A) as a surgical intervention .  The patient's history has been reviewed, patient examined, no change in status, stable for surgery.  I have reviewed the patient's chart and labs.  Questions were answered to the patient's satisfaction.     [name], [name]

## 2017-08-24 NOTE — H&P (View-Only) (Signed)
Subjective:  No recurrent chest pain overnight.  His catheterization yesterday had to be postponed until today because of a backlog in the cath schedule and that it was too late in the day to begin the case.  Enzymes negative.  No arrhythmia.  Objective:  Vital Signs in the last 24 hours: BP 112/66 (BP Location: Left Arm)   Pulse (!) 44   Temp 98.8 F (37.1 C) (Oral)   Resp 16   Ht 5\' 10"  (1.778 m)   Wt 108.1 kg (238 lb 6.4 oz)   SpO2 98%   BMI 34.21 kg/m   Physical Exam:  pleasant mildly obese male in no acute distress  Lungs:  Clear Cardiac:  Regular rhythm, normal S1 and S2, no S3 Extremities:  No edema present, radial pulses present bilaterally  Intake/Output from previous day: 09/12 0701 - 09/13 0700 In: 472.7 [P.O.:360; I.V.:112.7] Out: 0   Weight Filed Weights   08/23/17 0231 08/24/17 0545  Weight: 109.7 kg (241 lb 14.4 oz) 108.1 kg (238 lb 6.4 oz)    Lab Results: Basic Metabolic Panel:  Recent Labs  10/93/2308/10/29 1737 08/23/17 0525  NA 139 139  K 4.0 4.0  CL 107 107  CO2 24 26  GLUCOSE 124* 148*  BUN 16 14  CREATININE 0.95 0.93   CBC:  Recent Labs  08/22/17 1737 08/23/17 0525  WBC 9.0 8.3  HGB 12.9* 12.4*  HCT 40.1 38.4*  MCV 97.1 96.5  PLT 288 271   Cardiac Enzymes: Troponin (Point of Care Test)  Recent Labs  08/22/17 2136  TROPIPOC 0.00   Cardiac Panel (last 3 results)  Recent Labs  08/23/17 0525  TROPONINI <0.03    Telemetry: Sinus rhythm  Assessment/Plan:   1.  Prolonged chest discomfort suggestive of unstable angina patient with known coronary artery disease 2.  Diabetes mellitus 3.  Hypertension 4.  Hyperlipidemia with prior statin intolerance but able to tolerate low-dose Livalo  Recommendations:  Catheterization to determine patency of stents and whether any new disease.  Evidently it up next for case.  Disposition following results at catheterization.     Darden PalmerW. Spencer Analeia Ismael, Jr.  MD [facility] Cardiology  08/24/2017,  9:00 AM

## 2017-08-24 NOTE — Progress Notes (Signed)
Subjective:  No recurrent chest pain overnight.  His catheterization yesterday had to be postponed until today because of a backlog in the cath schedule and that it was too late in the day to begin the case.  Enzymes negative.  No arrhythmia.  Objective:  Vital Signs in the last 24 hours: BP 112/66 (BP Location: Left Arm)   Pulse (!) 44   Temp 98.8 F (37.1 C) (Oral)   Resp 16   Ht 5' 10" (1.778 m)   Wt 108.1 kg (238 lb 6.4 oz)   SpO2 98%   BMI 34.21 kg/m   Physical Exam:  pleasant mildly obese male in no acute distress  Lungs:  Clear Cardiac:  Regular rhythm, normal S1 and S2, no S3 Extremities:  No edema present, radial pulses present bilaterally  Intake/Output from previous day: 09/12 0701 - 09/13 0700 In: 472.7 [P.O.:360; I.V.:112.7] Out: 0   Weight Filed Weights   08/23/17 0231 08/24/17 0545  Weight: 109.7 kg (241 lb 14.4 oz) 108.1 kg (238 lb 6.4 oz)    Lab Results: Basic Metabolic Panel:  Recent Labs  08/22/17 1737 08/23/17 0525  NA 139 139  K 4.0 4.0  CL 107 107  CO2 24 26  GLUCOSE 124* 148*  BUN 16 14  CREATININE 0.95 0.93   CBC:  Recent Labs  08/22/17 1737 08/23/17 0525  WBC 9.0 8.3  HGB 12.9* 12.4*  HCT 40.1 38.4*  MCV 97.1 96.5  PLT 288 271   Cardiac Enzymes: Troponin (Point of Care Test)  Recent Labs  08/22/17 2136  TROPIPOC 0.00   Cardiac Panel (last 3 results)  Recent Labs  08/23/17 0525  TROPONINI <0.03    Telemetry: Sinus rhythm  Assessment/Plan:   1.  Prolonged chest discomfort suggestive of unstable angina patient with known coronary artery disease 2.  Diabetes mellitus 3.  Hypertension 4.  Hyperlipidemia with prior statin intolerance but able to tolerate low-dose Livalo  Recommendations:  Catheterization to determine patency of stents and whether any new disease.  Evidently it up next for case.  Disposition following results at catheterization.     W. Spencer Tilley, Jr.  MD FACC Cardiology  08/24/2017,  9:00 AM   

## 2017-08-24 NOTE — Discharge Summary (Signed)
Physician Discharge Summary  Patient ID: Benjamin Riley MRN: [id number] DOB/AGE: [birth year] 56 y.o.  Admit date: 08/22/2017 Discharge date: 08/24/2017  Primary Physician:  Dr. Casimiro Needle Altheimer  Primary Discharge Diagnosis:  1.  Unstable angina pectoris  Secondary Discharge Diagnosis: 2.  Type 2 diabetes mellitus 3.  Hypertensive heart disease 4.  Obesity 5.  Hyperlipidemia  Procedures:  Cardiac catheterization  Hospital Course: This 56 year old male has a previous history of a stent in the proximal LAD at an outside hospital in number of years ago.  He recently relocated to the area.  He has diabetes mellitus but does not get much in the way of regular exercise.  He presented to the emergency room with a prolonged episode of substernal chest discomfort in his left upper chest with some radiation to her shoulder accompanied with diaphoresis and sweating.  The pain was prolonged and lasted around an hour and a half and gradually resolved.  There were no EKG changes and troponins were negative and he was brought in for further evaluation.  Serial troponins were normal.  EKG was unchanged.  He was set up for catheterization but due to a heavy schedule could not be performed the next day and was done the day of discharge.  He had a normal left main coronary artery.  The previous stent was widely patent.  There was a proximal 40-50% LAD stenosis with some disease involving diagonal branches.  There is a distal 80-90% stenosis the apex that was not felt to be amenable to percutaneous therapy.  The right coronary artery had an anterior takeoff and contained moderate irregularity.  Circumflex contains scattered irregularities.  At this point in time the patient will be treated intensively medically.  We would like to get very low levels of LDL and would recommend initiation of a PCS K-9 inhibitor.  We will add metoprolol 25 mg to his regimen.  He will be given a prescription for nitroglycerin.   Discussed importance of weight loss and regular exercise with the patient.  Follow-up in the office in one week and call if recurrent problems.  Instructions regarding care of the catheterization site were given to the patient.  Discharge Exam: Blood pressure (!) 145/79, pulse (!) 51, temperature 98.8 F (37.1 C), temperature source Oral, resp. rate 13, height  (1.778 m), weight 108.1 kg (238 lb 6.4 oz), SpO2 100 %. Weight: 108.1 kg (238 lb 6.4 oz) Radial cath site clean and dry Labs: CBC:   Lab Results  Component Value Date   WBC 8.3 08/23/2017   HGB 12.4 (L) 08/23/2017   HCT 38.4 (L) 08/23/2017   MCV 96.5 08/23/2017   PLT 271 08/23/2017    CMP:  Recent Labs Lab 08/23/17 0525  NA 139  K 4.0  CL 107  CO2 26  BUN 14  CREATININE 0.93  CALCIUM 8.8*  GLUCOSE 148*   Cardiac Enzymes:  Recent Labs  08/23/17 0525  TROPONINI <0.03    Radiology: No active cardiopulmonary disease  EKG: Voltage for LVH.  Discharge Medications: Allergies as of 08/24/2017      Reactions   Statins Other (See Comments)   Leg cramps with simvastatin and atorvastatin       Medication List    TAKE these medications   aspirin EC 81 MG tablet Take 81 mg by mouth daily.   glipiZIDE 10 MG tablet Commonly known as:  GLUCOTROL Take 10 mg by mouth 2 (two) times daily.   JARDIANCE 10 MG Tabs tablet Generic  drug:  empagliflozin Take 10 mg by mouth daily.   LIVALO 2 MG Tabs Generic drug:  Pitavastatin Calcium Take 2 mg by mouth daily.   loratadine 10 MG tablet Commonly known as:  CLARITIN Take 10 mg by mouth daily.   losartan 25 MG tablet Commonly known as:  COZAAR Take 25 mg by mouth daily.   metFORMIN 500 MG 24 hr tablet Commonly known as:  GLUCOPHAGE-XR Take 500 mg by mouth 2 (two) times daily.   metoprolol succinate 25 MG 24 hr tablet Commonly known as:  TOPROL-XL Take 1 tablet (25 mg total) by mouth daily.   MULTI-VITAMINS Tabs Take 1 tablet by mouth daily.    nitroGLYCERIN 0.4 MG SL tablet Commonly known as:  NITROSTAT Place 1 tablet (0.4 mg total) under the tongue every 5 (five) minutes as needed for chest pain.   pioglitazone 30 MG tablet Commonly known as:  ACTOS Take 30 mg by mouth daily.   vitamin C 1000 MG tablet Take 1,000 mg by mouth daily.   Vitamin D3 2000 units capsule Take 2,000 Units by mouth 2 (two) times daily.            Discharge Care Instructions        Start     Ordered   08/24/17 0000  metoprolol succinate (TOPROL-XL) 25 MG 24 hr tablet  Daily     08/24/17 1317   08/24/17 0000  nitroGLYCERIN (NITROSTAT) 0.4 MG SL tablet  Every 5 min PRN     08/24/17 1317   08/24/17 0000  Increase activity slowly     08/24/17 1317   08/24/17 0000  Diet - low sodium heart healthy     08/24/17 1317   08/24/17 0000  Call MD for:  temperature >100.4     08/24/17 1317   08/24/17 0000  Call MD for:  persistant nausea and vomiting     08/24/17 1317   08/24/17 0000  Call MD for:  severe uncontrolled pain     08/24/17 1317   08/24/17 0000  Discharge instructions    Comments:  Heart Catheterization [facility]    A catheter was placed through the blood vessel in your arm, contrast was injected into the vessels, and pictures were taken.   You may feel some discomfort at the insertion site after the local anesthetic wears off. This discomfort should gradually improve over the next several days. Only take over-the-counter or prescription medicines for pain, discomfort, or fever as directed by your caregiver.  Don't use your right arm for any heavy lifting for the next 2-3 days.  He may use it for light activities. Complications are very uncommon after this procedure.Call my office if you develop any of the following symptoms:  Worsening pain.  Bleeding.  Severe swelling at the puncture site.  Lightheadedness.  Dizziness or fainting.  Fever or chills.  If oozing, bleeding, or a lump appears at the puncture site, apply firm  pressure directly to the site steadily for 15 minutes and go to the emergency department.  Keep the skin around the insertion site dry. You may take showers after 24 hours. If the area does get wet, dry the skin completely. Avoid baths until the skin puncture site heals, usually 5 to 7 days.  Rest  for the remainder of the day and avoid any heavy lifting (more than 10 pounds or 4.5 kg). Do not operate heavy machinery, drive, or make legal decisions for the first 24 hours after the procedure.  Have a responsible person drive you home.  You may resume your usual diet after the procedure. Avoid alcoholic beverages for 24 hours after the procedure.   08/24/17 1317      Followup plans and appointments: Follow-up Dr. Donnie Ahoilley in one week.  Time spent with patient to include physician time:  30 minutes.  Signed: Darden PalmerW. Spencer Jassmin Kemmerer, Jr. MD [facility] 08/24/2017, 1:18 PM

## 2017-09-15 ENCOUNTER — Other Ambulatory Visit (HOSPITAL_BASED_OUTPATIENT_CLINIC_OR_DEPARTMENT_OTHER): Payer: Self-pay

## 2017-09-15 DIAGNOSIS — G4710 Hypersomnia, unspecified: Secondary | ICD-10-CM

## 2017-09-15 DIAGNOSIS — R0683 Snoring: Secondary | ICD-10-CM

## 2017-09-15 DIAGNOSIS — R5383 Other fatigue: Secondary | ICD-10-CM

## 2017-10-13 ENCOUNTER — Encounter (HOSPITAL_BASED_OUTPATIENT_CLINIC_OR_DEPARTMENT_OTHER): Payer: BLUE CROSS/BLUE SHIELD

## 2018-10-02 ENCOUNTER — Other Ambulatory Visit: Payer: Self-pay | Admitting: Cardiology

## 2018-10-02 NOTE — Telephone Encounter (Signed)
° ° ° °  1. Which medications need to be refilled? (please list name of each medication and dose if known) Repatha 140mg  injection  2. Which pharmacy/location (including street and city if local pharmacy) is medication to be sent to? [facility] [address] [place]  3. Do they need a 30 day or 90 day supply? 30

## 2018-10-03 MED ORDER — EVOLOCUMAB 140 MG/ML ~~LOC~~ SOAJ: 140.0000 mg | SUBCUTANEOUS | 0 refills | Status: DC

## 2018-10-03 NOTE — Telephone Encounter (Signed)
Dr. Donnie Aho patient. Refilled Repatha per Dr. Vanetta Shawl approval.

## 2018-10-03 NOTE — Addendum Note (Signed)
Addended by: Lita Mains on: 10/03/2018 08:40 AM   Modules accepted: Orders

## 2018-10-28 ENCOUNTER — Other Ambulatory Visit: Payer: Self-pay | Admitting: Cardiology

## 2018-12-24 ENCOUNTER — Telehealth: Payer: Self-pay

## 2018-12-24 NOTE — Telephone Encounter (Signed)
Left message for patient to call back regarding his Repatha prior authorization appeal. Advised him that he will need to come to [facility] office (address given) to sign consent form in order for [place] to appeal medication denial through [facility].

## 2019-02-13 NOTE — Progress Notes (Signed)
Patient referred by Altheimer, Legrand Como, MD for coronary artery disease  Subjective:   Benjamin Riley, male    DOB: [birth year], 58 y.o.   MRN: [id number]   Chief Complaint  Patient presents with  . Coronary Artery Disease  . New Patient (Initial Visit)     HPI  58 year old Caucasian male with coronary artery disease s/p LAD PCI, hypertension, controlled type 2 DM, hyperlipidemia.  Patient was previously followed by Dr. Wynonia Lawman, last seen in 02/2018.  He is here with his wife today.  Patient lives with his wife, works as Armed forces technical officer, mostly desk job. He walks 1-2 miles daily with his wife without any chest pain, shortness of breath.  He is currently on aspirin and Prasugrel since his PCI, and is tolerating well without any bleeding issues.  He had a colonoscopy at age 53 which was reportedly normal.  Past Medical History:  Diagnosis Date  . Coronary artery disease   . Diabetes mellitus without complication ([place])   . Myocardial infarct [facility])      Past Surgical History:  Procedure Laterality Date  . BACK SURGERY    . CARDIAC CATHETERIZATION    . CORONARY ANGIOPLASTY    . CORONARY STENT PLACEMENT    . LEFT HEART CATH AND CORONARY ANGIOGRAPHY N/A 08/24/2017   Procedure: LEFT HEART CATH AND CORONARY ANGIOGRAPHY;  Surgeon: Sherren Mocha, MD;  Location: [place] [facility];  Service: Cardiovascular;  Laterality: N/A;  . SINUS SURGERY WITH INSTATRAK       Social History   Socioeconomic History  . Marital status: Married    Spouse name: Not on file  . Number of children: Not on file  . Years of education: Not on file  . Highest education level: Not on file  Occupational History  . Not on file  Social Needs  . Financial resource strain: Not on file  . Food insecurity:    Worry: Not on file    Inability: Not on file  . Transportation needs:    Medical: Not on file    Non-medical: Not on file  Tobacco Use  . Smoking status: Never Smoker  . Smokeless tobacco:  Never Used  Substance and Sexual Activity  . Alcohol use: No  . Drug use: No  . Sexual activity: Not on file  Lifestyle  . Physical activity:    Days per week: Not on file    Minutes per session: Not on file  . Stress: Not on file  Relationships  . Social connections:    Talks on phone: Not on file    Gets together: Not on file    Attends religious service: Not on file    Active member of club or organization: Not on file    Attends meetings of clubs or organizations: Not on file    Relationship status: Not on file  . Intimate partner violence:    Fear of current or ex partner: Not on file    Emotionally abused: Not on file    Physically abused: Not on file    Forced sexual activity: Not on file  Other Topics Concern  . Not on file  Social History Narrative  . Not on file     Current Outpatient Medications on File Prior to Visit  Medication Sig Dispense Refill  . Ascorbic Acid (VITAMIN C) 1000 MG tablet Take 1,000 mg by mouth daily.    Marland Kitchen aspirin EC 81 MG tablet Take 81 mg by mouth daily.    Marland Kitchen  Cholecalciferol (VITAMIN D3) 2000 units capsule Take 2,000 Units by mouth 2 (two) times daily.    . Coenzyme Q10 (COQ10) 100 MG CAPS Take 1 capsule by mouth daily.    Marland Kitchen ezetimibe (ZETIA) 10 MG tablet Take 10 mg by mouth daily.    Marland Kitchen glipiZIDE (GLUCOTROL) 10 MG tablet Take 10 mg by mouth 2 (two) times daily.  3  . JARDIANCE 10 MG TABS tablet Take 10 mg by mouth daily.  3  . loratadine (CLARITIN) 10 MG tablet Take 10 mg by mouth daily.    Marland Kitchen losartan (COZAAR) 25 MG tablet Take 25 mg by mouth daily.  3  . metFORMIN (GLUCOPHAGE-XR) 500 MG 24 hr tablet Take 500 mg by mouth 2 (two) times daily.  3  . metoprolol succinate (TOPROL-XL) 25 MG 24 hr tablet Take 1 tablet (25 mg total) by mouth daily. 30 tablet 12  . Multiple Vitamin (MULTI-VITAMINS) TABS Take 1 tablet by mouth daily.    . nitroGLYCERIN (NITROSTAT) 0.4 MG SL tablet Place 1 tablet (0.4 mg total) under the tongue every 5 (five)  minutes as needed for chest pain. 25 tablet 12  . pioglitazone (ACTOS) 30 MG tablet Take 30 mg by mouth daily.  2  . Pitavastatin Calcium 4 MG TABS Take 4 mg by mouth daily.    . prasugrel (EFFIENT) 10 MG TABS tablet Take 1 tablet by mouth daily.    . Semaglutide,0.25 or 0.5MG/DOS, 2 MG/1.5ML SOPN Inject 1 kit as directed once a week.     No current facility-administered medications on file prior to visit.     Cardiovascular studies:  EKG 03/0/2020: Sinus  Bradycardia 49 bpm.  LAFB  Cath 08/24/2017: LM: Normal LAD: Patent proximal LAD stent, moderate disease proximal-mid LAD, involving D1 and D2.  Severe stenosis in distal apical LAD LCx: Normal RCA: Diffuse mild to moderate disease.  Recent labs:  12/19/2018: Glucose 129. BUN/Cr 15/0.88. eGFR >90. Na/K141/4.8 Chol 88, TG 127, HDL 50, LDL18 HbA1C 6.4% TSH normal  Labs 05/28/2018: Glucose 189.  BUN/creatinine 18/0.9.  Sodium 138, potassium 4.7.  Rest of the CMP normal. Cholesterol 75, triglycerides 118, HDL 46, LDL 16  Review of Systems  Constitution: Negative for decreased appetite, malaise/fatigue, weight gain and weight loss.  HENT: Negative for congestion.   Eyes: Negative for visual disturbance.  Cardiovascular: Negative for chest pain, dyspnea on exertion, leg swelling, palpitations and syncope.  Respiratory: Negative for shortness of breath.   Endocrine: Negative for cold intolerance.  Hematologic/Lymphatic: Does not bruise/bleed easily.  Skin: Negative for itching and rash.  Musculoskeletal: Negative for myalgias.  Gastrointestinal: Negative for abdominal pain, nausea and vomiting.  Genitourinary: Negative for dysuria.  Neurological: Negative for dizziness and weakness.  Psychiatric/Behavioral: The patient is not nervous/anxious.   All other systems reviewed and are negative.        Vitals:   02/14/19 1248  BP: 126/79  Pulse: (!) 57  SpO2: 97%    Objective:   Physical Exam  Constitutional: He is  oriented to person, place, and time. He appears well-developed and well-nourished. No distress.  HENT:  Head: Normocephalic and atraumatic.  Eyes: Pupils are equal, round, and reactive to light. Conjunctivae are normal.  Neck: No JVD present.  Cardiovascular: Normal rate, regular rhythm and intact distal pulses.  Pulmonary/Chest: Effort normal and breath sounds normal. He has no wheezes. He has no rales.  Abdominal: Soft. Bowel sounds are normal. There is no rebound.  Musculoskeletal:  General: No edema.  Lymphadenopathy:    He has no cervical adenopathy.  Neurological: He is alert and oriented to person, place, and time. No cranial nerve deficit.  Skin: Skin is warm and dry.  Psychiatric: He has a normal mood and affect.  Nursing note and vitals reviewed.         Assessment & Recommendations:   58 year old Caucasian male with coronary artery disease s/p LAD PCI, hypertension, controlled type 2 DM, hyperlipidemia.  1. Coronary artery disease involving native coronary artery of native heart without angina pectoris Stable with no angina symptoms.  Given no bleeding issues and normal colonoscopy at age 31, reasonable to continue longer duration DAPT with aspirin and prasugrel. Prasugrel can be stopped at any time for any surgical procedure or bleeding issues, as he has completed mandatory DAPT post PCI. Refilled Repatha. Continue rest of the medical therapy. Follow up with PCP regarding DM management.   Thank you for referring the patient to [place]. Please feel free to contact with any questions.  Nigel Mormon, MD [facility] Pager: [phone] Office: [phone] If no answer Cell [phone]

## 2019-02-14 ENCOUNTER — Encounter: Payer: Self-pay | Admitting: Cardiology

## 2019-02-14 ENCOUNTER — Ambulatory Visit: Payer: BLUE CROSS/BLUE SHIELD | Admitting: Cardiology

## 2019-02-14 VITALS — BP 126/79 | HR 57 | Ht 70.0 in | Wt 233.0 lb

## 2019-02-14 DIAGNOSIS — I2510 Atherosclerotic heart disease of native coronary artery without angina pectoris: Secondary | ICD-10-CM | POA: Diagnosis not present

## 2019-02-14 MED ORDER — EVOLOCUMAB 140 MG/ML ~~LOC~~ SOAJ: 140.0000 mg | SUBCUTANEOUS | 4 refills | Status: DC

## 2019-02-26 ENCOUNTER — Ambulatory Visit: Payer: BLUE CROSS/BLUE SHIELD | Admitting: Cardiology

## 2019-05-21 ENCOUNTER — Telehealth: Payer: Self-pay

## 2019-06-18 NOTE — Telephone Encounter (Signed)
Error message

## 2019-09-05 ENCOUNTER — Other Ambulatory Visit: Payer: Self-pay

## 2019-09-18 ENCOUNTER — Other Ambulatory Visit: Payer: Self-pay | Admitting: Endocrinology

## 2019-09-18 DIAGNOSIS — Z20822 Contact with and (suspected) exposure to covid-19: Secondary | ICD-10-CM

## 2019-09-20 LAB — NOVEL CORONAVIRUS, NAA: SARS-CoV-2, NAA: DETECTED — AB

## 2019-09-22 ENCOUNTER — Telehealth: Payer: Self-pay | Admitting: Critical Care Medicine

## 2019-09-22 NOTE — Telephone Encounter (Signed)
I contacted this patient on 09/20/2019.  The patient had a coronavirus test positive on 10 7 20   The patient currently is asymptomatic.  I gave the patient the appropriate timeframe to remain in isolation.  He does have underlying cardiovascular disease and diabetes.  I indicated the appropriate time for isolation of the health department may be in touch with him.  I will send his results to his primary care provider and asked him to contact the primary care provider the following Monday on 09/23/2019

## 2019-11-18 ENCOUNTER — Telehealth: Payer: Self-pay | Admitting: Cardiology

## 2019-11-18 NOTE — Telephone Encounter (Signed)
°*  STAT* If patient is at the pharmacy, call can be transferred to refill team.   1. Which medications need to be refilled? (please list name of each medication and dose if known) Metoprolol 25mg   2. Which pharmacy/location (including street and city if local pharmacy) is medication to be sent to?[facility] [address] [place]  3. Do they need a 30 day or 90 day supply? [place]

## 2019-11-20 ENCOUNTER — Other Ambulatory Visit: Payer: Self-pay | Admitting: *Deleted

## 2019-11-20 MED ORDER — METOPROLOL SUCCINATE ER 25 MG PO TB24: 25.0000 mg | ORAL_TABLET | Freq: Every day | ORAL | 4 refills | Status: DC

## 2019-11-20 NOTE — Telephone Encounter (Signed)
Refill sent.

## 2020-02-16 NOTE — Progress Notes (Signed)
Appt rescheduled

## 2020-02-17 ENCOUNTER — Ambulatory Visit: Payer: BLUE CROSS/BLUE SHIELD | Admitting: Cardiology

## 2020-02-19 ENCOUNTER — Encounter: Payer: Self-pay | Admitting: Cardiology

## 2020-02-19 ENCOUNTER — Other Ambulatory Visit: Payer: Self-pay

## 2020-02-19 ENCOUNTER — Ambulatory Visit: Payer: No Typology Code available for payment source | Admitting: Cardiology

## 2020-02-19 VITALS — BP 122/65 | HR 61 | Temp 98.1°F | Ht 70.0 in | Wt 228.0 lb

## 2020-02-19 DIAGNOSIS — E119 Type 2 diabetes mellitus without complications: Secondary | ICD-10-CM

## 2020-02-19 DIAGNOSIS — I2510 Atherosclerotic heart disease of native coronary artery without angina pectoris: Secondary | ICD-10-CM

## 2020-02-19 NOTE — Progress Notes (Signed)
Patient referred by Altheimer, Legrand Como, MD for coronary artery disease  Subjective:   Benjamin Riley, male    DOB: 26-Jan-1961, 59 y.o.   MRN: [id number]   Chief Complaint  Patient presents with  . Coronary Artery Disease    1 year follow up     HPI  59 year old Caucasian male with coronary artery disease s/p LAD PCI, hypertension, controlled type 2 DM, hyperlipidemia.  Patient is doing well without any symptoms of angina.  He endorses not walking regularly, but hopes to resume this.  He has not gained any weight.  However, his hemoglobin A1c has gone from 6.6% in June 2020 to 9.5% in February 2021.  Is following up with Dr. Elyse Hsu regarding this.   Current Outpatient Medications on File Prior to Visit  Medication Sig Dispense Refill  . Ascorbic Acid (VITAMIN C) 1000 MG tablet Take 1,000 mg by mouth daily.    Marland Kitchen aspirin EC 81 MG tablet Take 81 mg by mouth daily.    . Cholecalciferol (VITAMIN D3) 2000 units capsule Take 2,000 Units by mouth 2 (two) times daily.    . Coenzyme Q10 (COQ10) 100 MG CAPS Take 1 capsule by mouth daily.    . Evolocumab (REPATHA SURECLICK) 944 MG/ML SOAJ Inject 140 mg into the skin as directed. Every two weeks 6 pen 4  . ezetimibe (ZETIA) 10 MG tablet Take 10 mg by mouth daily.    Marland Kitchen glipiZIDE (GLUCOTROL) 10 MG tablet Take 10 mg by mouth 2 (two) times daily.  3  . JARDIANCE 10 MG TABS tablet Take 10 mg by mouth daily.  3  . loratadine (CLARITIN) 10 MG tablet Take 10 mg by mouth daily.    Marland Kitchen losartan (COZAAR) 25 MG tablet Take 25 mg by mouth daily.  3  . metFORMIN (GLUCOPHAGE-XR) 500 MG 24 hr tablet Take 500 mg by mouth 2 (two) times daily.  3  . metoprolol succinate (TOPROL-XL) 25 MG 24 hr tablet Take 1 tablet (25 mg total) by mouth daily. 30 tablet 4  . Multiple Vitamin (MULTI-VITAMINS) TABS Take 1 tablet by mouth daily.    . nitroGLYCERIN (NITROSTAT) 0.4 MG SL tablet Place 1 tablet (0.4 mg total) under the tongue every 5 (five) minutes as needed for  chest pain. 25 tablet 12  . pioglitazone (ACTOS) 30 MG tablet Take 30 mg by mouth daily.  2  . Pitavastatin Calcium 4 MG TABS Take 4 mg by mouth daily.    . prasugrel (EFFIENT) 10 MG TABS tablet Take 1 tablet by mouth daily.    . Semaglutide,0.25 or 0.'5MG'$ /DOS, 2 MG/1.5ML SOPN Inject 1 kit as directed once a week.     No current facility-administered medications on file prior to visit.    Cardiovascular studies:  EKG 02/19/2020: Sinus rhythm 55 bpm. Left anterior fascicular block.   EKG 03/0/2020: Sinus  Bradycardia 49 bpm.  LAFB  Cath 08/24/2017: LM: Normal LAD: Patent proximal LAD stent, moderate disease proximal-mid LAD, involving D1 and D2.  Severe stenosis in distal apical LAD LCx: Normal RCA: Diffuse mild to moderate disease.  Recent labs: 01/2020: Chol 115, TG 151, HDL 51, LDL 40 HbA1C 9.5%  12/19/2018: Glucose 129. BUN/Cr 15/0.88. eGFR >90. Na/K141/4.8 Chol 88, TG 127, HDL 50, LDL18 HbA1C 6.4% TSH normal  Labs 05/28/2018: Glucose 189.  BUN/creatinine 18/0.9.  Sodium 138, potassium 4.7.  Rest of the CMP normal. Cholesterol 75, triglycerides 118, HDL 46, LDL 16  Review of Systems  Cardiovascular: Negative for chest pain,  dyspnea on exertion, leg swelling, palpitations and syncope.        Vitals:   02/19/20 0923  BP: 122/65  Pulse: 61  Temp: 98.1 F (36.7 C)  SpO2: 96%    Objective:   Physical Exam  Constitutional: He appears well-developed and well-nourished.  Neck: No JVD present.  Cardiovascular: Normal rate, regular rhythm, normal heart sounds and intact distal pulses.  No murmur heard. Pulmonary/Chest: Effort normal and breath sounds normal. He has no wheezes. He has no rales.  Musculoskeletal:        General: No edema.  Nursing note and vitals reviewed.         Assessment & Recommendations:   59 year old Caucasian male with coronary artery disease s/p LAD PCI, hypertension, controlled type 2 DM, hyperlipidemia.   Coronary artery  disease involving native coronary artery of native heart without angina pectoris: Stable with no angina symptoms.  Okay to stop prasugrel.  Continue aspirin 81 mg daily.  Continue Repatha but for statin.  LDL 40.  Type 2 diabetes mellitus: Hemoglobin A1c 9.5%.  He has follow-up with Dr. Elyse Hsu regarding this next month.   Follow-up in 1 year  Benjamin Mormon, MD [facility] Pager: [phone] Office: [phone] If no answer Cell [phone]

## 2020-03-14 ENCOUNTER — Ambulatory Visit: Payer: Self-pay | Attending: Internal Medicine

## 2020-03-14 DIAGNOSIS — Z23 Encounter for immunization: Secondary | ICD-10-CM

## 2020-03-14 NOTE — Progress Notes (Signed)
   Covid-19 Vaccination Clinic  Name:  Dean Goldner    MRN: [id number] DOB: 1960/12/17  03/14/2020  Mr. Steil was observed post Covid-19 immunization for 15 minutes without incident. He was provided with Vaccine Information Sheet and instruction to access the V-Safe system.   Mr. Bovey was instructed to call 911 with any severe reactions post vaccine: Marland Kitchen Difficulty breathing  . Swelling of face and throat  . A fast heartbeat  . A bad rash all over body  . Dizziness and weakness   Immunizations Administered    Name Date Dose VIS Date Route   Pfizer COVID-19 Vaccine 03/14/2020 11:59 AM 0.3 mL 11/22/2019 Intramuscular   Manufacturer: [employer], [employer]   Lot: [id number]   NDC: [address]-1

## 2020-04-07 ENCOUNTER — Ambulatory Visit: Payer: Self-pay

## 2020-04-07 ENCOUNTER — Ambulatory Visit: Payer: Self-pay | Attending: Internal Medicine

## 2020-04-07 DIAGNOSIS — Z23 Encounter for immunization: Secondary | ICD-10-CM

## 2020-04-07 NOTE — Progress Notes (Signed)
   Covid-19 Vaccination Clinic  Name:  Benjamin Riley    MRN: [id number] DOB: 1961-10-30  04/07/2020  [name] was observed post Covid-19 immunization for 15 minutes without incident. He was provided with Vaccine Information Sheet and instruction to access the V-Safe system.   [name] was instructed to call 911 with any severe reactions post vaccine: Marland Kitchen Difficulty breathing  . Swelling of face and throat  . A fast heartbeat  . A bad rash all over body  . Dizziness and weakness   Immunizations Administered    Name Date Dose VIS Date Route   Pfizer COVID-19 Vaccine 04/07/2020 12:37 PM 0.3 mL 02/05/2019 Intramuscular   Manufacturer: [employer], [employer]   Lot: [id number]   NDC: [address]-8

## 2020-04-09 ENCOUNTER — Telehealth: Payer: Self-pay

## 2020-04-09 NOTE — Telephone Encounter (Signed)
Pt called re: Repatha medication. Please call pt. [phone]

## 2020-04-10 ENCOUNTER — Other Ambulatory Visit: Payer: Self-pay

## 2020-04-10 ENCOUNTER — Other Ambulatory Visit: Payer: Self-pay | Admitting: Cardiology

## 2020-04-10 MED ORDER — REPATHA SURECLICK 140 MG/ML ~~LOC~~ SOAJ: 140.0000 mg | SUBCUTANEOUS | 4 refills | Status: DC

## 2020-04-10 NOTE — Telephone Encounter (Signed)
Delice Bison is handling preauthorization for Repatha

## 2021-02-18 ENCOUNTER — Ambulatory Visit: Payer: No Typology Code available for payment source | Admitting: Cardiology

## 2021-02-18 ENCOUNTER — Encounter: Payer: Self-pay | Admitting: Cardiology

## 2021-02-18 ENCOUNTER — Other Ambulatory Visit: Payer: Self-pay

## 2021-02-18 VITALS — BP 143/77 | HR 52 | Temp 97.7°F | Resp 16 | Ht 70.0 in | Wt 241.0 lb

## 2021-02-18 DIAGNOSIS — E119 Type 2 diabetes mellitus without complications: Secondary | ICD-10-CM

## 2021-02-18 DIAGNOSIS — I10 Essential (primary) hypertension: Secondary | ICD-10-CM

## 2021-02-18 DIAGNOSIS — I2510 Atherosclerotic heart disease of native coronary artery without angina pectoris: Secondary | ICD-10-CM

## 2021-02-18 DIAGNOSIS — E782 Mixed hyperlipidemia: Secondary | ICD-10-CM

## 2021-02-18 NOTE — Progress Notes (Signed)
Patient referred by Altheimer, Legrand Como, MD for coronary artery disease  Subjective:   Benjamin Riley, male    DOB: [birth year], 60 y.o.   MRN: [id number]   Chief Complaint  Patient presents with  . Coronary Artery Disease  . Follow-up     HPI  60 year old Caucasian male with coronary artery disease s/p LAD PCI, hypertension, controlled type 2 DM, hyperlipidemia.  Patient is doing well. He admits that he has not been as active lately. Reviewed recent labs with the patient. Blood pressure is elevated today, but reports it is usually much lower.   Current Outpatient Medications on File Prior to Visit  Medication Sig Dispense Refill  . Ascorbic Acid (VITAMIN C) 1000 MG tablet Take 1,000 mg by mouth daily.    Marland Kitchen aspirin EC 81 MG tablet Take 81 mg by mouth daily.    . Cholecalciferol (VITAMIN D3) 2000 units capsule Take 2,000 Units by mouth 2 (two) times daily.    . Coenzyme Q10 (COQ10) 100 MG CAPS Take 1 capsule by mouth daily.    . Evolocumab (REPATHA SURECLICK) 229 MG/ML SOAJ Inject 140 mg into the skin as directed. Every two weeks 6 pen 4  . ezetimibe (ZETIA) 10 MG tablet Take 10 mg by mouth daily.    Marland Kitchen glipiZIDE (GLUCOTROL) 10 MG tablet Take 10 mg by mouth 2 (two) times daily.  3  . JARDIANCE 10 MG TABS tablet Take 10 mg by mouth daily.  3  . loratadine (CLARITIN) 10 MG tablet Take 10 mg by mouth daily.    Marland Kitchen losartan (COZAAR) 25 MG tablet Take 25 mg by mouth daily.  3  . metFORMIN (GLUCOPHAGE-XR) 500 MG 24 hr tablet Take 500 mg by mouth 2 (two) times daily.  3  . Multiple Vitamin (MULTI-VITAMINS) TABS Take 1 tablet by mouth daily.    . nitroGLYCERIN (NITROSTAT) 0.4 MG SL tablet Place 1 tablet (0.4 mg total) under the tongue every 5 (five) minutes as needed for chest pain. 25 tablet 12  . pioglitazone (ACTOS) 30 MG tablet Take 30 mg by mouth daily.  2  . Pitavastatin Calcium 4 MG TABS Take 4 mg by mouth daily.    . Semaglutide,0.25 or 0.5MG /DOS, 2 MG/1.5ML SOPN Inject 1 kit as  directed once a week.     No current facility-administered medications on file prior to visit.    Cardiovascular studies:  EKG 02/18/2021: Sinus rhythm 53 bpm Left ventricular hypertrophy  Coronary angiogram 08/24/2017: LM: Normal LAD: Patent proximal LAD stent, moderate disease proximal-mid LAD, involving D1 and D2.  Severe stenosis in distal apical LAD LCx: Normal RCA: Diffuse mild to moderate disease.  Recent labs: 01/25/2021: Glucose 139, BUN/Cr 31/0.9. EGFR >90. Na/K 138/4.5. Rest of the CMP normal HbA1C 7.1% Chol 119, TG 129, HDL 54, LDL 47  01/2020: Chol 115, TG 151, HDL 51, LDL 40 HbA1C 9.5%  12/19/2018: Glucose 129. BUN/Cr 15/0.88. eGFR >90. Na/K141/4.8 Chol 88, TG 127, HDL 50, LDL18 HbA1C 6.4% TSH normal  Labs 05/28/2018: Glucose 189.  BUN/creatinine 18/0.9.  Sodium 138, potassium 4.7.  Rest of the CMP normal. Cholesterol 75, triglycerides 118, HDL 46, LDL 16  Review of Systems  Cardiovascular: Negative for chest pain, dyspnea on exertion, leg swelling, palpitations and syncope.        Vitals:   02/18/21 1028  BP: (!) 143/77  Pulse: (!) 52  Resp: 16  Temp: 97.7 F (36.5 C)  SpO2: 100%    Objective:   Physical Exam Vitals  and nursing note reviewed.  Constitutional:      Appearance: He is well-developed.  Neck:     Vascular: No JVD.  Cardiovascular:     Rate and Rhythm: Normal rate and regular rhythm.     Pulses: Intact distal pulses.     Heart sounds: Normal heart sounds. No murmur heard.   Pulmonary:     Effort: Pulmonary effort is normal.     Breath sounds: Normal breath sounds. No wheezing or rales.           Assessment & Recommendations:   60 year old Caucasian male with coronary artery disease s/p LAD PCI, hypertension, controlled type 2 DM, hyperlipidemia.  Coronary artery disease involving native coronary artery of native heart without angina pectoris: Stable with no angina symptoms.  Continue aspirin 81 mg daily.   Continue Repatha, Zetia. LDL 47. Okay to stop pitvastatin,   HypertensionL BP elevated today, Recommend home monitoring. He is reluctant to make any changes today.   Type 2 diabetes mellitus: Improved. Continue f/u w/PCP  F/u in 1 year  Nigel Mormon, MD [facility] Pager: [phone] Office: [phone] If no answer Cell [phone]

## 2021-06-04 ENCOUNTER — Other Ambulatory Visit: Payer: Self-pay | Admitting: Cardiology

## 2022-02-16 ENCOUNTER — Ambulatory Visit: Payer: No Typology Code available for payment source | Admitting: Cardiology

## 2022-02-24 ENCOUNTER — Ambulatory Visit: Payer: No Typology Code available for payment source | Admitting: Cardiology

## 2022-03-07 ENCOUNTER — Encounter: Payer: Self-pay | Admitting: Cardiology

## 2022-03-07 ENCOUNTER — Other Ambulatory Visit: Payer: Self-pay

## 2022-03-07 ENCOUNTER — Ambulatory Visit: Payer: No Typology Code available for payment source | Admitting: Cardiology

## 2022-03-07 VITALS — BP 176/78 | HR 53 | Temp 98.0°F | Resp 16 | Ht 70.0 in | Wt 247.0 lb

## 2022-03-07 DIAGNOSIS — E782 Mixed hyperlipidemia: Secondary | ICD-10-CM

## 2022-03-07 DIAGNOSIS — I10 Essential (primary) hypertension: Secondary | ICD-10-CM

## 2022-03-07 DIAGNOSIS — I2510 Atherosclerotic heart disease of native coronary artery without angina pectoris: Secondary | ICD-10-CM

## 2022-03-07 NOTE — Progress Notes (Signed)
? ? ?Patient referred by Altheimer, Legrand Como, MD for coronary artery disease ? ?Subjective:  ? ?Benjamin Riley, male    DOB: 1961/05/06, 61 y.o.   MRN: [id number] ? ? ?Chief Complaint  ?Patient presents with  ? Coronary Artery Disease  ? Follow-up  ?  1 year  ? ? ? ?HPI ? ?61 year old Caucasian male with hypertension, hyperlipidemia, controlled type 2 DM, CAD ? ?Patient is doing well, denies chest pain. Blood pressure is elevated today./ He does not check it regularly at home.  ? ? ?Current Outpatient Medications:  ?  Ascorbic Acid (VITAMIN C) 1000 MG tablet, Take 1,000 mg by mouth daily., Disp: , Rfl:  ?  aspirin EC 81 MG tablet, Take 81 mg by mouth daily., Disp: , Rfl:  ?  Cholecalciferol (VITAMIN D3) 2000 units capsule, Take 2,000 Units by mouth 2 (two) times daily., Disp: , Rfl:  ?  Coenzyme Q10 (COQ10) 100 MG CAPS, Take 1 capsule by mouth daily., Disp: , Rfl:  ?  Evolocumab (REPATHA SURECLICK) 086 MG/ML SOAJ, INJECT 140 MG  UNDER THE SKIN EVERY 2 WEEKS AS DIRECTED, Disp: 6 mL, Rfl: 4 ?  ezetimibe (ZETIA) 10 MG tablet, Take 10 mg by mouth daily., Disp: , Rfl:  ?  glipiZIDE (GLUCOTROL) 10 MG tablet, Take 10 mg by mouth 2 (two) times daily., Disp: , Rfl: 3 ?  JARDIANCE 10 MG TABS tablet, Take 10 mg by mouth daily., Disp: , Rfl: 3 ?  loratadine (CLARITIN) 10 MG tablet, Take 10 mg by mouth daily., Disp: , Rfl:  ?  losartan (COZAAR) 25 MG tablet, Take 25 mg by mouth daily., Disp: , Rfl: 3 ?  metFORMIN (GLUCOPHAGE-XR) 500 MG 24 hr tablet, Take 500 mg by mouth 2 (two) times daily., Disp: , Rfl: 3 ?  Multiple Vitamin (MULTI-VITAMINS) TABS, Take 1 tablet by mouth daily., Disp: , Rfl:  ?  nitroGLYCERIN (NITROSTAT) 0.4 MG SL tablet, Place 1 tablet (0.4 mg total) under the tongue every 5 (five) minutes as needed for chest pain., Disp: 25 tablet, Rfl: 12 ?  pioglitazone (ACTOS) 30 MG tablet, Take 30 mg by mouth daily., Disp: , Rfl: 2 ?  Semaglutide,0.25 or 0.5MG/DOS, 2 MG/1.5ML SOPN, Inject 1 kit as directed once a week., Disp:  , Rfl:  ? ?Cardiovascular studies: ? ?EKG 03/07/2022: ?Sinus rhythm 51 bpm ?Left ventricular hypertrophy ? ?Coronary angiogram 08/24/2017: ?LM: Normal ?LAD: Patent proximal LAD stent, moderate disease proximal-mid LAD, involving D1 and D2.  Severe stenosis in distal apical LAD ?LCx: Normal ?RCA: Diffuse mild to moderate disease. ? ?Recent labs: ?12/23/2021: ?Glucose 132, BUN/Cr 15/0.82. EGFR >90. Na/K 138/4.5. Rest of the CMP normal ?HbA1C 7.6% ?Chol 99, TG 119, HDL 47, LDL 36 ?TSH 2.8 normal ? ? ?Review of Systems  ?Cardiovascular:  Negative for chest pain, dyspnea on exertion, leg swelling, palpitations and syncope.  ? ?   ? ?Vitals:  ? 03/07/22 1131 03/07/22 1139  ?BP: (!) 162/76 (!) 176/78  ?Pulse: (!) 49 (!) 53  ?Resp: 16   ?Temp: 98 ?F (36.7 ?C)   ?SpO2: 100%   ? ? ?Objective:  ? Physical Exam ?Vitals and nursing note reviewed.  ?Constitutional:   ?   Appearance: He is well-developed.  ?Neck:  ?   Vascular: No JVD.  ?Cardiovascular:  ?   Rate and Rhythm: Normal rate and regular rhythm.  ?   Pulses: Intact distal pulses.  ?   Heart sounds: Normal heart sounds. No murmur heard. ?Pulmonary:  ?  Effort: Pulmonary effort is normal.  ?   Breath sounds: Normal breath sounds. No wheezing or rales.  ? ? ?  ICD-10-CM   ?1. Coronary artery disease involving native coronary artery of native heart without angina pectoris  I25.10 EKG 12-Lead  ?  ?2. Mixed hyperlipidemia  E78.2   ?  ?3. Essential hypertension  I10   ?  ? ? ? ? ?   ?Assessment & Recommendations:  ? ?61 year old Caucasian male with hypertension, hyperlipidemia, controlled type 2 DM, CAD ? ?CAD: ?H/o LAD pCI ?Stable with no angina symptoms.  ?Continue aspirin 81 mg daily.  ?Continue Repatha, Zetia. LDL 36 (12/2021). ? ?Hypertension: ?Uncontrolled.  ?Take 2 tablets of losartan 25 mg daily. ?F/u in 4 weeks.  If systolic blood pressure remains >140 mmHg on home log, as well as in the office, consider increasing losartan to 100 mg daily.  After that, recommend  checking BMP in 1 week. ? ?Type 2 diabetes mellitus: ?HbA1C 7.6% (12/2021). Goal <7% ?Continue f/u w/PCP ? ?F/u in 4 weeks  ? ?Nigel Mormon, MD ?[facility] ?Pager: [phone] ?Office: [phone] ?If no answer Cell [phone] ?  ? ?

## 2022-04-12 ENCOUNTER — Encounter: Payer: Self-pay | Admitting: Cardiology

## 2022-04-12 ENCOUNTER — Ambulatory Visit: Payer: No Typology Code available for payment source | Admitting: Cardiology

## 2022-04-12 VITALS — BP 140/79 | HR 53 | Temp 98.0°F | Resp 16 | Ht 70.0 in | Wt 238.0 lb

## 2022-04-12 DIAGNOSIS — I2510 Atherosclerotic heart disease of native coronary artery without angina pectoris: Secondary | ICD-10-CM

## 2022-04-12 DIAGNOSIS — I10 Essential (primary) hypertension: Secondary | ICD-10-CM

## 2022-04-12 MED ORDER — LOSARTAN POTASSIUM 50 MG PO TABS: 50.0000 mg | ORAL_TABLET | Freq: Every day | ORAL | 3 refills | Status: DC

## 2022-04-12 NOTE — Progress Notes (Signed)
? ? ?Patient referred by Altheimer, Legrand Como, MD for coronary artery disease ? ?Subjective:  ? ?Benjamin Riley, male    DOB: [birth year], 61 y.o.   MRN: [id number] ? ? ?Chief Complaint  ?Patient presents with  ? Coronary Artery Disease  ? Hypertension  ? Follow-up  ?  4 week  ? ? ? ?HPI ? ?61 y/o Caucasian male with hypertension, hyperlipidemia, controlled type 2 DM, CAD ? ?Patient is doing well. Blood pressure is now better controlled.  ? ? ?Current Outpatient Medications:  ?  Ascorbic Acid (VITAMIN C) 1000 MG tablet, Take 1,000 mg by mouth daily., Disp: , Rfl:  ?  aspirin EC 81 MG tablet, Take 81 mg by mouth daily., Disp: , Rfl:  ?  Cholecalciferol (VITAMIN D3) 2000 units capsule, Take 2,000 Units by mouth 2 (two) times daily., Disp: , Rfl:  ?  Coenzyme Q10 (COQ10) 100 MG CAPS, Take 1 capsule by mouth daily., Disp: , Rfl:  ?  Cyanocobalamin 2000 MCG TBCR, Take 1 tablet by mouth daily., Disp: , Rfl:  ?  Evolocumab (REPATHA SURECLICK) 595 MG/ML SOAJ, INJECT 140 MG  UNDER THE SKIN EVERY 2 WEEKS AS DIRECTED, Disp: 6 mL, Rfl: 4 ?  ezetimibe (ZETIA) 10 MG tablet, Take 10 mg by mouth daily., Disp: , Rfl:  ?  glipiZIDE (GLUCOTROL) 10 MG tablet, Take 10 mg by mouth 2 (two) times daily., Disp: , Rfl: 3 ?  JARDIANCE 10 MG TABS tablet, Take 10 mg by mouth daily., Disp: , Rfl: 3 ?  loratadine (CLARITIN) 10 MG tablet, Take 10 mg by mouth daily., Disp: , Rfl:  ?  losartan (COZAAR) 25 MG tablet, Take 50 mg by mouth daily., Disp: , Rfl: 3 ?  metFORMIN (GLUCOPHAGE-XR) 500 MG 24 hr tablet, Take 500 mg by mouth 2 (two) times daily., Disp: , Rfl: 3 ?  Multiple Vitamin (MULTI-VITAMINS) TABS, Take 1 tablet by mouth daily., Disp: , Rfl:  ?  nitroGLYCERIN (NITROSTAT) 0.4 MG SL tablet, Place 1 tablet (0.4 mg total) under the tongue every 5 (five) minutes as needed for chest pain., Disp: 25 tablet, Rfl: 12 ?  pioglitazone (ACTOS) 30 MG tablet, Take 30 mg by mouth daily., Disp: , Rfl: 2 ?  Semaglutide,0.25 or 0.5MG/DOS, 2 MG/1.5ML SOPN, Inject 1  kit as directed once a week., Disp: , Rfl:  ? ?Cardiovascular studies: ? ?EKG 03/07/2022: ?Sinus rhythm 51 bpm ?Left ventricular hypertrophy ? ?Coronary angiogram 08/24/2017: ?LM: Normal ?LAD: Patent proximal LAD stent, moderate disease proximal-mid LAD, involving D1 and D2.  Severe stenosis in distal apical LAD ?LCx: Normal ?RCA: Diffuse mild to moderate disease. ? ?Recent labs: ?12/23/2021: ?Glucose 132, BUN/Cr 15/0.82. EGFR >90. Na/K 138/4.5. Rest of the CMP normal ?HbA1C 7.6% ?Chol 99, TG 119, HDL 47, LDL 36 ?TSH 2.8 normal ? ? ?Review of Systems  ?Cardiovascular:  Negative for chest pain, dyspnea on exertion, leg swelling, palpitations and syncope.  ? ?   ? ?Vitals:  ? 04/12/22 0831  ?BP: 140/79  ?Pulse: (!) 53  ?Resp: 16  ?Temp: 98 ?F (36.7 ?C)  ?SpO2: 99%  ? ? ?Objective:  ? Physical Exam ?Vitals and nursing note reviewed.  ?Constitutional:   ?   Appearance: He is well-developed.  ?Neck:  ?   Vascular: No JVD.  ?Cardiovascular:  ?   Rate and Rhythm: Normal rate and regular rhythm.  ?   Pulses: Intact distal pulses.  ?   Heart sounds: Normal heart sounds. No murmur heard. ?Pulmonary:  ?  Effort: Pulmonary effort is normal.  ?   Breath sounds: Normal breath sounds. No wheezing or rales.  ? ? ?  ICD-10-CM   ?1. Coronary artery disease involving native coronary artery of native heart without angina pectoris  I25.10   ?  ?2. Essential hypertension  I10   ?  ? ? ?Meds ordered this encounter  ?Medications  ? losartan (COZAAR) 50 MG tablet  ?  Sig: Take 1 tablet (50 mg total) by mouth daily.  ?  Dispense:  90 tablet  ?  Refill:  3  ? ? ? ?   ?Assessment & Recommendations:  ? ?61 y/o Caucasian male with hypertension, hyperlipidemia, controlled type 2 DM, CAD ? ?CAD: ?H/o LAD pCI ?Stable with no angina symptoms.  ?Continue aspirin 81 mg daily.  ?Continue Repatha, Zetia. LDL 36 (12/2021). ? ?Hypertension: ?Improving. ?Refilled losartan at 50 mg daily. ?Can check BMP with PCP in next few months. ? ?Type 2 diabetes  mellitus: ?HbA1C 7.6% (12/2021). Goal <7% ?Continue f/u w/PCP ? ?F/u in 1 year ? ?Nigel Mormon, MD ?[facility] ?Pager: [phone] ?Office: [phone] ?If no answer Cell [phone] ?  ? ?

## 2022-04-14 ENCOUNTER — Ambulatory Visit: Payer: No Typology Code available for payment source | Admitting: Cardiology

## 2022-05-11 ENCOUNTER — Encounter (HOSPITAL_BASED_OUTPATIENT_CLINIC_OR_DEPARTMENT_OTHER): Payer: Self-pay | Admitting: Emergency Medicine

## 2022-05-11 ENCOUNTER — Inpatient Hospital Stay (HOSPITAL_BASED_OUTPATIENT_CLINIC_OR_DEPARTMENT_OTHER)
Admission: EM | Admit: 2022-05-11 | Discharge: 2022-05-15 | DRG: 417 | Disposition: A | Discharge status: Home / Self Care | Payer: No Typology Code available for payment source | Attending: Internal Medicine | Admitting: Internal Medicine

## 2022-05-11 ENCOUNTER — Emergency Department (HOSPITAL_BASED_OUTPATIENT_CLINIC_OR_DEPARTMENT_OTHER): Payer: No Typology Code available for payment source

## 2022-05-11 ENCOUNTER — Other Ambulatory Visit: Payer: Self-pay

## 2022-05-11 DIAGNOSIS — I252 Old myocardial infarction: Secondary | ICD-10-CM | POA: Diagnosis not present

## 2022-05-11 DIAGNOSIS — K2970 Gastritis, unspecified, without bleeding: Secondary | ICD-10-CM | POA: Diagnosis not present

## 2022-05-11 DIAGNOSIS — E119 Type 2 diabetes mellitus without complications: Secondary | ICD-10-CM | POA: Diagnosis present

## 2022-05-11 DIAGNOSIS — R112 Nausea with vomiting, unspecified: Secondary | ICD-10-CM

## 2022-05-11 DIAGNOSIS — Z7982 Long term (current) use of aspirin: Secondary | ICD-10-CM | POA: Diagnosis not present

## 2022-05-11 DIAGNOSIS — Z888 Allergy status to other drugs, medicaments and biological substances status: Secondary | ICD-10-CM

## 2022-05-11 DIAGNOSIS — Z7984 Long term (current) use of oral hypoglycemic drugs: Secondary | ICD-10-CM | POA: Diagnosis not present

## 2022-05-11 DIAGNOSIS — Z955 Presence of coronary angioplasty implant and graft: Secondary | ICD-10-CM

## 2022-05-11 DIAGNOSIS — E669 Obesity, unspecified: Secondary | ICD-10-CM | POA: Diagnosis present

## 2022-05-11 DIAGNOSIS — K226 Gastro-esophageal laceration-hemorrhage syndrome: Secondary | ICD-10-CM

## 2022-05-11 DIAGNOSIS — Z6833 Body mass index (BMI) 33.0-33.9, adult: Secondary | ICD-10-CM | POA: Diagnosis not present

## 2022-05-11 DIAGNOSIS — K810 Acute cholecystitis: Principal | ICD-10-CM | POA: Diagnosis present

## 2022-05-11 DIAGNOSIS — R079 Chest pain, unspecified: Secondary | ICD-10-CM | POA: Insufficient documentation

## 2022-05-11 DIAGNOSIS — I2510 Atherosclerotic heart disease of native coronary artery without angina pectoris: Secondary | ICD-10-CM | POA: Diagnosis present

## 2022-05-11 DIAGNOSIS — K82A1 Gangrene of gallbladder in cholecystitis: Secondary | ICD-10-CM | POA: Diagnosis not present

## 2022-05-11 DIAGNOSIS — Z823 Family history of stroke: Secondary | ICD-10-CM

## 2022-05-11 DIAGNOSIS — Z808 Family history of malignant neoplasm of other organs or systems: Secondary | ICD-10-CM

## 2022-05-11 DIAGNOSIS — R7989 Other specified abnormal findings of blood chemistry: Secondary | ICD-10-CM

## 2022-05-11 DIAGNOSIS — I10 Essential (primary) hypertension: Secondary | ICD-10-CM | POA: Diagnosis present

## 2022-05-11 DIAGNOSIS — Z794 Long term (current) use of insulin: Secondary | ICD-10-CM

## 2022-05-11 DIAGNOSIS — R778 Other specified abnormalities of plasma proteins: Secondary | ICD-10-CM | POA: Diagnosis present

## 2022-05-11 DIAGNOSIS — Z880 Allergy status to penicillin: Secondary | ICD-10-CM

## 2022-05-11 DIAGNOSIS — Z79899 Other long term (current) drug therapy: Secondary | ICD-10-CM

## 2022-05-11 DIAGNOSIS — K8020 Calculus of gallbladder without cholecystitis without obstruction: Secondary | ICD-10-CM

## 2022-05-11 DIAGNOSIS — N400 Enlarged prostate without lower urinary tract symptoms: Secondary | ICD-10-CM | POA: Diagnosis present

## 2022-05-11 DIAGNOSIS — Z8249 Family history of ischemic heart disease and other diseases of the circulatory system: Secondary | ICD-10-CM

## 2022-05-11 DIAGNOSIS — E782 Mixed hyperlipidemia: Secondary | ICD-10-CM | POA: Diagnosis not present

## 2022-05-11 DIAGNOSIS — D649 Anemia, unspecified: Secondary | ICD-10-CM | POA: Diagnosis not present

## 2022-05-11 DIAGNOSIS — K4020 Bilateral inguinal hernia, without obstruction or gangrene, not specified as recurrent: Secondary | ICD-10-CM | POA: Diagnosis not present

## 2022-05-11 DIAGNOSIS — R1013 Epigastric pain: Secondary | ICD-10-CM | POA: Diagnosis present

## 2022-05-11 DIAGNOSIS — K5750 Diverticulosis of both small and large intestine without perforation or abscess without bleeding: Secondary | ICD-10-CM | POA: Diagnosis not present

## 2022-05-11 LAB — TROPONIN I (HIGH SENSITIVITY)
Troponin I (High Sensitivity): 26 ng/L — ABNORMAL HIGH (ref <18)
Troponin I (High Sensitivity): 30 ng/L — ABNORMAL HIGH (ref <18)
Troponin I (High Sensitivity): 28 ng/L — ABNORMAL HIGH (ref <18)

## 2022-05-11 LAB — COMPREHENSIVE METABOLIC PANEL
ALT: 20 U/L (ref 0–44)
AST: 18 U/L (ref 15–41)
Albumin: 4.6 g/dL (ref 3.5–5.0)
Alkaline Phosphatase: 90 U/L (ref 38–126)
Anion gap: 15 (ref 5–15)
BUN: 15 mg/dL (ref 6–20)
CO2: 26 mmol/L (ref 22–32)
Calcium: 10.2 mg/dL (ref 8.9–10.3)
Chloride: 99 mmol/L (ref 98–111)
Creatinine, Ser: 0.94 mg/dL (ref 0.61–1.24)
GFR, Estimated: >60 mL/min (ref >60)
Glucose, Bld: 175 mg/dL — ABNORMAL HIGH (ref 70–99)
Potassium: 4.3 mmol/L (ref 3.5–5.1)
Sodium: 140 mmol/L (ref 135–145)
Total Bilirubin: 0.4 mg/dL (ref 0.3–1.2)
Total Protein: 7.8 g/dL (ref 6.5–8.1)

## 2022-05-11 LAB — CBC
HCT: 40.3 % (ref 39.0–52.0)
Hemoglobin: 13.4 g/dL (ref 13.0–17.0)
MCH: 31.7 pg (ref 26.0–34.0)
MCHC: 33.3 g/dL (ref 30.0–36.0)
MCV: 95.3 fL (ref 80.0–100.0)
Platelets: 372 10*3/uL (ref 150–400)
RBC: 4.23 MIL/uL (ref 4.22–5.81)
RDW: 13.4 % (ref 11.5–15.5)
WBC: 15.6 10*3/uL — ABNORMAL HIGH (ref 4.0–10.5)
nRBC: 0 % (ref 0.0–0.2)

## 2022-05-11 LAB — URINALYSIS, ROUTINE W REFLEX MICROSCOPIC
Bilirubin Urine: NEGATIVE
Glucose, UA: >1000 mg/dL — AB
Hgb urine dipstick: NEGATIVE
Ketones, ur: 40 mg/dL — AB
Leukocytes,Ua: NEGATIVE
Nitrite: NEGATIVE
Specific Gravity, Urine: >1.046 — ABNORMAL HIGH (ref 1.005–1.030)
pH: 5.5 (ref 5.0–8.0)

## 2022-05-11 LAB — HEMOGLOBIN A1C
Hgb A1c MFr Bld: 7.5 % — ABNORMAL HIGH (ref 4.8–5.6)
Mean Plasma Glucose: 168.55 mg/dL

## 2022-05-11 LAB — LIPASE, BLOOD: Lipase: 24 U/L (ref 11–51)

## 2022-05-11 LAB — HEPARIN LEVEL (UNFRACTIONATED): Heparin Unfractionated: <0.1 IU/mL — ABNORMAL LOW (ref 0.30–0.70)

## 2022-05-11 LAB — GLUCOSE, CAPILLARY: Glucose-Capillary: 173 mg/dL — ABNORMAL HIGH (ref 70–99)

## 2022-05-11 MED ORDER — VITAMIN D 25 MCG (1000 UNIT) PO TABS
2000.0000 [IU] | ORAL_TABLET | Freq: Every day | ORAL | Status: DC
  Administered 2022-05-12 – 2022-05-15 (×3): 2000 [IU] via ORAL
  Filled 2022-05-11 (×3): qty 2

## 2022-05-11 MED ORDER — HEPARIN BOLUS VIA INFUSION
4000.0000 [IU] | Freq: Once | INTRAVENOUS | Status: DC
Start: 2022-05-11 — End: 2022-05-11

## 2022-05-11 MED ORDER — FENTANYL CITRATE PF 50 MCG/ML IJ SOSY
50.0000 ug | PREFILLED_SYRINGE | Freq: Once | INTRAMUSCULAR | Status: AC
  Administered 2022-05-11: 50 ug via INTRAVENOUS
  Filled 2022-05-11: qty 1

## 2022-05-11 MED ORDER — HYDROMORPHONE HCL 1 MG/ML IJ SOLN
1.0000 mg | Freq: Once | INTRAMUSCULAR | Status: AC
  Administered 2022-05-11: 1 mg via INTRAVENOUS
  Filled 2022-05-11: qty 1

## 2022-05-11 MED ORDER — ONDANSETRON HCL 4 MG/2ML IJ SOLN
4.0000 mg | Freq: Once | INTRAMUSCULAR | Status: AC | PRN
  Administered 2022-05-11: 4 mg via INTRAVENOUS
  Filled 2022-05-11: qty 2

## 2022-05-11 MED ORDER — NITROGLYCERIN 0.4 MG SL SUBL
0.4000 mg | SUBLINGUAL_TABLET | SUBLINGUAL | Status: DC | PRN
  Administered 2022-05-11 (×3): 0.4 mg via SUBLINGUAL
  Filled 2022-05-11: qty 1

## 2022-05-11 MED ORDER — HEPARIN (PORCINE) 25000 UT/250ML-% IV SOLN
1200.0000 [IU]/h | INTRAVENOUS | Status: DC
  Filled 2022-05-11: qty 250

## 2022-05-11 MED ORDER — COQ10 100 MG PO CAPS: 100.0000 mg | ORAL_CAPSULE | Freq: Every day | ORAL | Status: DC

## 2022-05-11 MED ORDER — EZETIMIBE 10 MG PO TABS
10.0000 mg | ORAL_TABLET | Freq: Every day | ORAL | Status: DC
  Administered 2022-05-12 – 2022-05-15 (×4): 10 mg via ORAL
  Filled 2022-05-11 (×4): qty 1

## 2022-05-11 MED ORDER — SODIUM CHLORIDE 0.9 % IV SOLN: INTRAVENOUS | Status: AC

## 2022-05-11 MED ORDER — INSULIN ASPART 100 UNIT/ML IJ SOLN
0.0000 [IU] | Freq: Three times a day (TID) | INTRAMUSCULAR | Status: DC
  Administered 2022-05-12: 2 [IU] via SUBCUTANEOUS
  Administered 2022-05-12 – 2022-05-13 (×2): 1 [IU] via SUBCUTANEOUS
  Administered 2022-05-14: 3 [IU] via SUBCUTANEOUS
  Administered 2022-05-15: 2 [IU] via SUBCUTANEOUS

## 2022-05-11 MED ORDER — ALUM & MAG HYDROXIDE-SIMETH 200-200-20 MG/5ML PO SUSP
30.0000 mL | Freq: Once | ORAL | Status: AC
  Administered 2022-05-11: 30 mL via ORAL
  Filled 2022-05-11: qty 30

## 2022-05-11 MED ORDER — VITAMIN B-12 1000 MCG PO TABS
2000.0000 ug | ORAL_TABLET | Freq: Every day | ORAL | Status: DC
Start: 2022-05-12 — End: 2022-05-15
  Administered 2022-05-12 – 2022-05-15 (×3): 2000 ug via ORAL
  Filled 2022-05-11 (×3): qty 2

## 2022-05-11 MED ORDER — MORPHINE SULFATE (PF) 2 MG/ML IV SOLN
2.0000 mg | INTRAVENOUS | Status: DC | PRN
  Administered 2022-05-13 – 2022-05-14 (×2): 2 mg via INTRAVENOUS
  Filled 2022-05-11 (×2): qty 1

## 2022-05-11 MED ORDER — SODIUM CHLORIDE 0.9 % IV BOLUS
1000.0000 mL | Freq: Once | INTRAVENOUS | Status: AC
  Administered 2022-05-11: 1000 mL via INTRAVENOUS

## 2022-05-11 MED ORDER — ASCORBIC ACID 500 MG PO TABS
1000.0000 mg | ORAL_TABLET | Freq: Every day | ORAL | Status: DC
  Administered 2022-05-13 – 2022-05-15 (×2): 1000 mg via ORAL
  Filled 2022-05-11 (×3): qty 2

## 2022-05-11 MED ORDER — ONDANSETRON HCL 4 MG/2ML IJ SOLN
4.0000 mg | Freq: Once | INTRAMUSCULAR | Status: AC
Start: 2022-05-11 — End: 2022-05-11
  Administered 2022-05-11: 4 mg via INTRAVENOUS
  Filled 2022-05-11: qty 2

## 2022-05-11 MED ORDER — LORATADINE 10 MG PO TABS
10.0000 mg | ORAL_TABLET | Freq: Every day | ORAL | Status: DC
  Administered 2022-05-12 – 2022-05-15 (×4): 10 mg via ORAL
  Filled 2022-05-11 (×4): qty 1

## 2022-05-11 MED ORDER — SUCRALFATE 1 GM/10ML PO SUSP
1.0000 g | Freq: Three times a day (TID) | ORAL | Status: DC
  Administered 2022-05-11 – 2022-05-12 (×5): 1 g via ORAL
  Filled 2022-05-11 (×5): qty 10

## 2022-05-11 MED ORDER — HYDROCODONE-ACETAMINOPHEN 5-325 MG PO TABS
1.0000 | ORAL_TABLET | Freq: Once | ORAL | Status: AC
  Administered 2022-05-11: 1 via ORAL
  Filled 2022-05-11: qty 1

## 2022-05-11 MED ORDER — ENOXAPARIN SODIUM 40 MG/0.4ML IJ SOSY: 40.0000 mg | PREFILLED_SYRINGE | INTRAMUSCULAR | Status: DC

## 2022-05-11 MED ORDER — PANTOPRAZOLE SODIUM 40 MG IV SOLR
40.0000 mg | Freq: Every day | INTRAVENOUS | Status: DC
  Administered 2022-05-11 – 2022-05-12 (×2): 40 mg via INTRAVENOUS
  Filled 2022-05-11 (×2): qty 10

## 2022-05-11 MED ORDER — ASPIRIN 81 MG PO TBEC
81.0000 mg | DELAYED_RELEASE_TABLET | Freq: Every day | ORAL | Status: DC
  Administered 2022-05-13 – 2022-05-15 (×2): 81 mg via ORAL
  Filled 2022-05-11 (×3): qty 1

## 2022-05-11 MED ORDER — LOSARTAN POTASSIUM 50 MG PO TABS
50.0000 mg | ORAL_TABLET | Freq: Every day | ORAL | Status: DC
  Administered 2022-05-12 – 2022-05-15 (×4): 50 mg via ORAL
  Filled 2022-05-11 (×4): qty 1

## 2022-05-11 MED ORDER — ONDANSETRON HCL 4 MG/2ML IJ SOLN
4.0000 mg | Freq: Four times a day (QID) | INTRAMUSCULAR | Status: DC | PRN
  Administered 2022-05-14: 4 mg via INTRAVENOUS
  Filled 2022-05-11: qty 2

## 2022-05-11 MED ORDER — IOHEXOL 300 MG/ML  SOLN
100.0000 mL | Freq: Once | INTRAMUSCULAR | Status: AC | PRN
Start: 2022-05-11 — End: 2022-05-11
  Administered 2022-05-11: 80 mL via INTRAVENOUS

## 2022-05-11 MED ORDER — HYDROMORPHONE HCL 1 MG/ML IJ SOLN
1.0000 mg | INTRAMUSCULAR | Status: DC | PRN
  Administered 2022-05-12 – 2022-05-14 (×2): 1 mg via INTRAVENOUS
  Filled 2022-05-11 (×2): qty 1

## 2022-05-11 NOTE — ED Notes (Signed)
Attempt to call report.  RN unavailable.

## 2022-05-11 NOTE — Assessment & Plan Note (Signed)
HbA1C 7.6% (12/2021). Goal <7% -place on sensitive SSI

## 2022-05-11 NOTE — ED Notes (Signed)
Attempt to call report.  Floor RN unavailable.  Nurse to call back when ready.

## 2022-05-11 NOTE — Consult Note (Signed)
CARDIOLOGY CONSULT NOTE  Patient ID: Benjamin Riley MRN: [id number] DOB/AGE: September 22, 1961 61 y.o.  Admit date: 05/11/2022 Referring Physician: [facility] hospitalist Reason for Consultation:  Troponin elevation  HPI:   61 y/o Caucasian male with hypertension, hyperlipidemia, controlled type 2 DM, CAD, with epigastric pain. Cardiology consulted for troponin elevation.  Patient was recently seen by me in the office on 5/2/203 and had been doing well without any angina symptoms. On 05/11/2022, [facility] with complaints of constant epigastric pain, associated with nausea and vomiting. He denies any chest pain, shortness of breath. Pain is not at all similar to his angina symptoms prior to LAD PCI a few years ago. Pain persisted for almost 12 hours, without any significant aggravating or relieving factors. Nitroglycerin did not help the pain. Trop HS mildly elevated, but flat.   Past Medical History:  Diagnosis Date   Coronary artery disease    Diabetes mellitus without complication (HCC)    Myocardial infarct [facility](HCC)      Past Surgical History:  Procedure Laterality Date   BACK SURGERY     CARDIAC CATHETERIZATION     CORONARY ANGIOPLASTY     CORONARY STENT PLACEMENT     LEFT HEART CATH AND CORONARY ANGIOGRAPHY N/A 08/24/2017   Procedure: LEFT HEART CATH AND CORONARY ANGIOGRAPHY;  Surgeon: [name], Michael, MD;  [facility];  Service: Cardiovascular;  Laterality: N/A;   SINUS SURGERY WITH INSTATRAK        Family History  Problem Relation Age of Onset   Stroke Mother    Macular degeneration Mother    Thyroid cancer Mother    Heart disease Father    Heart disease Brother      Social History: Social History   Socioeconomic History   Marital status: Married    Spouse name: Not on file   Number of children: Not on file   Years of education: Not on file   Highest education level: Not on file  Occupational History   Not on file  Tobacco Use    Smoking status: Never   Smokeless tobacco: Never  Vaping Use   Vaping Use: Never used  Substance and Sexual Activity   Alcohol use: No   Drug use: No   Sexual activity: Not on file  Other Topics Concern   Not on file  Social History Narrative   Not on file   Social Determinants of Health   Financial Resource Strain: Not on file  Food Insecurity: Not on file  Transportation Needs: Not on file  Physical Activity: Not on file  Stress: Not on file  Social Connections: Not on file  Intimate Partner Violence: Not on file     Medications Prior to Admission  Medication Sig Dispense Refill Last Dose   Ascorbic Acid (VITAMIN C) 1000 MG tablet Take 1,000 mg by mouth daily.   05/10/2022   aspirin EC 81 MG tablet Take 81 mg by mouth daily.   05/10/2022   Cholecalciferol (VITAMIN D3) 2000 units capsule Take 2,000 Units by mouth daily.   05/10/2022   Coenzyme Q10 (COQ10) 100 MG CAPS Take 100 mg by mouth daily.   05/10/2022   Cyanocobalamin 2000 MCG TBCR Take 2,000 mcg by mouth daily.   05/10/2022   Evolocumab (REPATHA SURECLICK) 140 MG/ML SOAJ INJECT 140 MG  UNDER THE SKIN EVERY 2 WEEKS AS DIRECTED (Patient taking differently: Inject 140 mg into the muscle every 14 (fourteen) days.) 6 mL 4 04/27/2022  ezetimibe (ZETIA) 10 MG tablet Take 10 mg by mouth daily.   05/10/2022   glipiZIDE (GLUCOTROL) 10 MG tablet Take 10 mg by mouth daily before breakfast.  3 05/10/2022   JARDIANCE 10 MG TABS tablet Take 10 mg by mouth daily.  3 05/10/2022   loratadine (CLARITIN) 10 MG tablet Take 10 mg by mouth daily.   05/10/2022   losartan (COZAAR) 50 MG tablet Take 1 tablet (50 mg total) by mouth daily. 90 tablet 3 05/10/2022   metFORMIN (GLUCOPHAGE-XR) 500 MG 24 hr tablet Take 500 mg by mouth 2 (two) times daily.  3 05/10/2022   nitroGLYCERIN (NITROSTAT) 0.4 MG SL tablet Place 1 tablet (0.4 mg total) under the tongue every 5 (five) minutes as needed for chest pain. 25 tablet 12 unk last dose   pioglitazone (ACTOS) 30  MG tablet Take 30 mg by mouth daily.  2 05/10/2022   Semaglutide, 2 MG/DOSE, (OZEMPIC, 2 MG/DOSE,) 8 MG/3ML SOPN Inject 2 mg into the skin once a week. Sundays   05/08/2022    Review of Systems  Cardiovascular:  Negative for chest pain, dyspnea on exertion, leg swelling, palpitations and syncope.  Gastrointestinal:  Positive for abdominal pain.     Physical Exam: Physical Exam Vitals and nursing note reviewed.  Constitutional:      General: He is not in acute distress. Neck:     Vascular: No JVD.  Cardiovascular:     Rate and Rhythm: Normal rate and regular rhythm.     Heart sounds: Normal heart sounds. No murmur heard. Pulmonary:     Effort: Pulmonary effort is normal.     Breath sounds: Normal breath sounds. No wheezing or rales.  Abdominal:     Tenderness: There is abdominal tenderness in the epigastric area.  Genitourinary:    Testes:        Right: Swelling not present.        Left: Swelling not present.       Imaging/tests reviewed and independently interpreted: Lab Results: CT abdomen 05/11/2022: Distal colonic diverticulosis without evidence of diverticulitis. Cholelithiasis. Mild prostatic enlargement. Probable BILATERAL inguinal hernias containing fat. No acute intra-abdominal or intrapelvic process identified. Aortic Atherosclerosis (ICD10-I70.0).  Cardiac Studies:  Telemetry 05/12/2022: No significant arrhtymia  EKG 05/11/2022: Sinus rhythm  LVG  Echocardiogram: Pending   Assessment & Recommendations:  61 y/o Caucasian male with hypertension, hyperlipidemia, controlled type 2 DM, CAD, with epigastric pain. Cardiology consulted for troponin elevation.  Epigastric pain, troponin elevation: Non-anginal pain. No ischemic changes on EKG. Troponin elevation does not reflect ACS. I reckon this could related to relative dehydration yesterday from several episodes of vomiting. I do not think he needs ischemic workup at this time.   I suspect GI etiology, such  as gastritis. Okay to hold Aspirin for now, pending GI evaluation-whether inpatient or outpatient. Consider PPI use.  I will see him in office for f/u in 4 weeks  Discussed interpretation of tests and management recommendations with the primary team     Nigel Mormon, MD Pager: [phone] Office: [phone]

## 2022-05-11 NOTE — Assessment & Plan Note (Signed)
Continue losartan. 

## 2022-05-11 NOTE — ED Notes (Signed)
Gave Nitro SL x 3 five minutes apart.  States has no relief  Epigastric pain 5/10  BP remain stable

## 2022-05-11 NOTE — ED Provider Notes (Signed)
[place] EMERGENCY DEPT Provider Note   CSN: [id number] Arrival date & time: 05/11/22  1242     History  Chief Complaint  Patient presents with   Abdominal Pain    Benjamin Riley is a 61 y.o. male.   Abdominal Pain  Patient presents with acute epigastric pain.  It started at 1 AM this morning, woke him up from his sleep.  Associated with nausea and vomiting, denies any hematemesis.  States he was vomiting all night.  The vomiting makes the pain better somewhat, nothing else is alleviated the pain, nothing is made the pain worse.  Denies any blood in stool, diarrhea, constipation.  He denies any chest pain or feeling short of breath, no fevers.  No new travel or restaurants, does not take anti-inflammatory medicine or drink alcohol.  No history of ulcers, status post appendectomy denies any other abdominal surgeries.  Patient has medical history of type 2 diabetes, CAD (h/o LAD PCI 2018. , hypertension.    Home Medications Prior to Admission medications   Medication Sig Start Date End Date Taking? Authorizing Provider  Ascorbic Acid (VITAMIN C) 1000 MG tablet Take 1,000 mg by mouth daily.    [provider]  aspirin EC 81 MG tablet Take 81 mg by mouth daily.    [provider]  Cholecalciferol (VITAMIN D3) 2000 units capsule Take 2,000 Units by mouth 2 (two) times daily.    [provider]  Coenzyme Q10 (COQ10) 100 MG CAPS Take 1 capsule by mouth daily.    [provider]  Cyanocobalamin 2000 MCG TBCR Take 1 tablet by mouth daily.    [provider]  Evolocumab (REPATHA SURECLICK) 720 MG/ML SOAJ INJECT 140 MG  UNDER THE SKIN EVERY 2 WEEKS AS DIRECTED 06/04/21   Patwardhan, Manish J, MD  ezetimibe (ZETIA) 10 MG tablet Take 10 mg by mouth daily.    [provider]  glipiZIDE (GLUCOTROL) 10 MG tablet Take 10 mg by mouth 2 (two) times daily. 07/17/17   [provider]  JARDIANCE 10 MG TABS tablet Take 10 mg by  mouth daily. 07/10/17   [provider]  loratadine (CLARITIN) 10 MG tablet Take 10 mg by mouth daily.    [provider]  losartan (COZAAR) 50 MG tablet Take 1 tablet (50 mg total) by mouth daily. 04/12/22   Patwardhan, Reynold Bowen, MD  metFORMIN (GLUCOPHAGE-XR) 500 MG 24 hr tablet Take 500 mg by mouth 2 (two) times daily. 07/17/17   [provider]  Multiple Vitamin (MULTI-VITAMINS) TABS Take 1 tablet by mouth daily.    [provider]  nitroGLYCERIN (NITROSTAT) 0.4 MG SL tablet Place 1 tablet (0.4 mg total) under the tongue every 5 (five) minutes as needed for chest pain. 08/24/17   Jacolyn Reedy, MD  pioglitazone (ACTOS) 30 MG tablet Take 30 mg by mouth daily. 07/17/17   [provider]  Semaglutide,0.25 or 0.5MG/DOS, 2 MG/1.5ML SOPN Inject 1 kit as directed once a week. 05/31/18   [provider]      Allergies    Lisinopril, Penicillins, and Statins    Review of Systems   Review of Systems  Gastrointestinal:  Positive for abdominal pain.   Physical Exam Updated Vital Signs BP (!) 151/74   Pulse (!) 56   Temp 97.8 F (36.6 C)   Resp 16   Ht _0  (1.778 m)   Wt 106.6 kg   SpO2 96%   BMI 33.72 kg/m  Physical Exam  Vitals and nursing note reviewed. Exam conducted with a chaperone present.  Constitutional:      Appearance: Normal appearance.  HENT:     Head: Normocephalic and atraumatic.  Eyes:     General: No scleral icterus.       Right eye: No discharge.        Left eye: No discharge.     Extraocular Movements: Extraocular movements intact.     Pupils: Pupils are equal, round, and reactive to light.  Cardiovascular:     Rate and Rhythm: Normal rate and regular rhythm.     Pulses: Normal pulses.     Heart sounds: Normal heart sounds. No murmur heard.   No friction rub. No gallop.  Pulmonary:     Effort: Pulmonary effort is normal. No respiratory distress.     Breath sounds: Normal breath sounds.  Abdominal:      General: Abdomen is flat. Bowel sounds are normal. There is no distension.     Palpations: Abdomen is soft.     Tenderness: There is abdominal tenderness in the epigastric area. There is no right CVA tenderness or left CVA tenderness.  Skin:    General: Skin is warm and dry.     Coloration: Skin is not jaundiced.  Neurological:     Mental Status: He is alert. Mental status is at baseline.     Coordination: Coordination normal.    ED Results / Procedures / Treatments   Labs (all labs ordered are listed, but only abnormal results are displayed) Labs Reviewed  COMPREHENSIVE METABOLIC PANEL - Abnormal; Notable for the following components:      Result Value   Glucose, Bld 175 (*)    All other components within normal limits  CBC - Abnormal; Notable for the following components:   WBC 15.6 (*)    All other components within normal limits  URINALYSIS, ROUTINE W REFLEX MICROSCOPIC - Abnormal; Notable for the following components:   Specific Gravity, Urine >1.046 (*)    Glucose, UA >1,000 (*)    Ketones, ur 40 (*)    Protein, ur TRACE (*)    All other components within normal limits  TROPONIN I (HIGH SENSITIVITY) - Abnormal; Notable for the following components:   Troponin I (High Sensitivity) 26 (*)    All other components within normal limits  TROPONIN I (HIGH SENSITIVITY) - Abnormal; Notable for the following components:   Troponin I (High Sensitivity) 30 (*)    All other components within normal limits  LIPASE, BLOOD    EKG EKG Interpretation  Date/Time:  Wednesday May 11 2022 13:33:46 EDT Ventricular Rate:  59 PR Interval:  161 QRS Duration: 100 QT Interval:  445 QTC Calculation: 441 R Axis:   -24 Text Interpretation: Sinus rhythm Abnormal R-wave progression, early transition Left ventricular hypertrophy Minimal ST elevation, inferior leads Confirmed by Thamas Jaegers ([id number]) on 05/11/2022 2:05:30 PM  Radiology CT Abdomen Pelvis W Contrast  Result Date:  05/11/2022 CLINICAL DATA:  Acute mid abdominal pain since early this morning, nausea, vomiting. Past history of MI, diabetes mellitus, appendectomy EXAM: CT ABDOMEN AND PELVIS WITH CONTRAST TECHNIQUE: Multidetector CT imaging of the abdomen and pelvis was performed using the standard protocol following bolus administration of intravenous contrast. RADIATION DOSE REDUCTION: This exam was performed according to the departmental dose-optimization program which includes automated exposure control, adjustment of the mA and/or kV according to patient size and/or use of iterative reconstruction technique. CONTRAST:  4m OMNIPAQUE IOHEXOL 300 MG/ML SOLN IV. No  oral contrast. COMPARISON:  None FINDINGS: Lower chest: Minimal dependent atelectasis posterior RIGHT lower lobe. Hepatobiliary: 8 mm calcified gallstone in gallbladder. Gallbladder and liver otherwise normal appearance. Pancreas: Normal appearance Spleen: Normal appearance Adrenals/Urinary Tract: Adrenal glands normal appearance. RIGHT renal cyst 3.2 cm diameter; no follow-up imaging recommended. Kidneys, ureters, and bladder otherwise normal appearance. Stomach/Bowel: Appendix surgically absent by history. Diverticulosis of descending and sigmoid colon without evidence of diverticulitis. Rectum under distended, unable to exclude wall thickening in this setting. Duodenal diverticulum at cranial aspect of third portion. Remaining bowel loops unremarkable. Stomach normal appearance. Vascular/Lymphatic: Atherosclerotic calcifications aorta and iliac arteries without aneurysm. No adenopathy. Reproductive: Mild prostatic enlargement. Seminal vesicles unremarkable Other: Probable BILATERAL inguinal hernias containing fat. No free air or free fluid. Musculoskeletal: Mild degenerative disc disease changes lower lumbar spine. IMPRESSION: Distal colonic diverticulosis without evidence of diverticulitis. Cholelithiasis. Mild prostatic enlargement. Probable BILATERAL inguinal  hernias containing fat. No acute intra-abdominal or intrapelvic process identified. Aortic Atherosclerosis (ICD10-I70.0). Electronically Signed   By: Lavonia Dana M.D.   On: 05/11/2022 14:18   DG Chest Port 1 View  Result Date: 05/11/2022 CLINICAL DATA:  Upper abdominal pain with vomiting since 1 a.m. EXAM: PORTABLE CHEST 1 VIEW COMPARISON:  08/22/2017 FINDINGS: Numerous leads and wires project over the chest. Midline trachea. Borderline cardiomegaly. Mediastinal contours otherwise within normal limits. No pleural effusion or pneumothorax. Clear lungs. IMPRESSION: No acute cardiopulmonary disease. Electronically Signed   By: Abigail Miyamoto M.D.   On: 05/11/2022 14:55    Procedures .Critical Care Performed by: Sherrill Raring, PA-C Authorized by: Sherrill Raring, PA-C   Critical care provider statement:    Critical care time (minutes):  30   Critical care start time:  05/11/2022 3:20 PM   Critical care end time:  05/11/2022 3:51 PM   Critical care time was exclusive of:  Separately billable procedures and treating other patients   Critical care was necessary to treat or prevent imminent or life-threatening deterioration of the following conditions:  Cardiac failure and dehydration   Critical care was time spent personally by me on the following activities:  Development of treatment plan with patient or surrogate, discussions with consultants, evaluation of patient's response to treatment, examination of patient, ordering and review of laboratory studies, ordering and review of radiographic studies, ordering and performing treatments and interventions, pulse oximetry, re-evaluation of patient's condition and review of old charts   Care discussed with: admitting provider      Medications Ordered in ED Medications  nitroGLYCERIN (NITROSTAT) SL tablet 0.4 mg (has no administration in time range)  iohexol (OMNIPAQUE) 300 MG/ML solution 100 mL (80 mLs Intravenous Contrast Given 05/11/22 1403)  sodium chloride  0.9 % bolus 1,000 mL (0 mLs Intravenous Stopped 05/11/22 1525)  fentaNYL (SUBLIMAZE) injection 50 mcg (50 mcg Intravenous Given 05/11/22 1417)  ondansetron (ZOFRAN) injection 4 mg (4 mg Intravenous Given 05/11/22 1417)  HYDROmorphone (DILAUDID) injection 1 mg (1 mg Intravenous Given 05/11/22 1525)  alum & mag hydroxide-simeth (MAALOX/MYLANTA) 200-200-20 MG/5ML suspension 30 mL (30 mLs Oral Given 05/11/22 1523)    ED Course/ Medical Decision Making/ A&P                           Medical Decision Making Amount and/or Complexity of Data Reviewed Labs: ordered. Radiology: ordered.  Risk OTC drugs. Prescription drug management. Decision regarding hospitalization.   This patient presents to the ED for concern of epigastric pain, this involves an extensive number  of treatment options, and is a complaint that carries with it a high risk of complications and morbidity.  The differential diagnosis includes atypical ACS, cholecystitis, pancreatitis, SBO, dehydration, AKI, gross electrolyte derangement  Patient's presentation is complicated by their history of hypertension and ACS   Additional history obtained:   Independent historian: wife  I reviewed  external records including patient's cardiology notes.  His last left heart cath was in 2018 and notable for LAD PCI.  No recent echocardiograms.   Lab Tests:  I ordered, viewed, and personally interpreted labs.  The pertinent results include: Leukocytosis of 15.  There is no gross electrolyte derangement or AKI, patient's glucose is elevated at 175 and there is glucosuria in his urine and some proteinuria.  No evidence of underlying UTI.  Lipase is within normal limits.    Imaging Studies ordered:  I directly visualized the chest x-ray and CT abdomen pelvis with contrast, which showed no acute findings  I agree with the radiologist interpretation    ECG/Cardiac monitoring:   Per my interpretation, EKG shows minimal ST elevation of  inferior leads without reciprocal finding  The patient was maintained on a cardiac monitor.  Visualized monitor strip which showed sinus bradycardia with a heart rate of 56 per my interpretation.    Medicines ordered and prescription drug management:  I ordered medication including: Dilaudid, sodium chloride bolus, Maalox, fentanyl, Zofran  I have reviewed the patients home medicines and have made adjustments as needed    Consultations Obtained:  I requested consultation with the cardiology and the hospitalist.  Discussed lab and imaging findings as well as pertinent plan - they recommend:   Dr. Virgina Jock with cardiology -he agrees that given epigastric pain, nausea and vomiting with elevated troponins in the context of no explanation the abdominal work-up patient would benefit from admission, trending troponins, and he will see the patient in the morning.  Advises to admit to [facility].  Spoke with Dr. Roosevelt Locks who agrees to admit the patient.   Reevaluation:  After the interventions noted above, I reevaluated the patient and found patient is still having persistent pain despite multiple rounds of narcotics.   Problems addressed / ED Course: Admission for troponin trending, echocardiogram and cardiology evaluation.   Social Determinants of Health: Speciality follow up established   Disposition:   After consideration of the diagnostic results and the patients response to treatment, I feel that the patent would benefit from admission.         Final Clinical Impression(s) / ED Diagnoses Final diagnoses:  Elevated troponin  Epigastric pain    Rx / DC Orders ED Discharge Orders     None         Sherrill Raring, Hershal Coria 05/11/22 1611    Luna Fuse, MD 05/21/22 1659

## 2022-05-11 NOTE — Assessment & Plan Note (Signed)
Continue Zetia -Also on Repatha outpatient

## 2022-05-11 NOTE — Progress Notes (Signed)
ANTICOAGULATION CONSULT NOTE - Initial Consult  Pharmacy Consult for heparin Indication: chest pain/ACS  Allergies  Allergen Reactions   Lisinopril    Penicillins    Statins Other (See Comments)    Leg cramps with simvastatin and atorvastatin     Patient Measurements: Height: 5\' 10"  (177.8 cm) Weight: 106.6 kg (235 lb) IBW/kg (Calculated) : 73 Heparin Dosing Weight: 95.9kg  Vital Signs: Temp: 97.8 F (36.6 C) (05/31 1253) BP: 151/74 (05/31 1515) Pulse Rate: 56 (05/31 1515)  Labs: Recent Labs    05/11/22 1257 05/11/22 1505  HGB 13.4  --   HCT 40.3  --   PLT 372  --   CREATININE 0.94  --   TROPONINIHS 26* 30*    Estimated Creatinine Clearance: 102.1 mL/min (by C-G formula based on SCr of 0.94 mg/dL).   Medical History: Past Medical History:  Diagnosis Date   Coronary artery disease    Diabetes mellitus without complication ([place])    Myocardial infarct ([place])     Medications:  Infusions:   heparin      Assessment: 68 yom presented to the ED with abdominal pain. Troponin is slightly elevated and now starting IV heparin. Baseline CBC is WNL. He is not on anticoagulation PTA.   Goal of Therapy:  Heparin level 0.3-0.7 units/ml Monitor platelets by anticoagulation protocol: Yes   Plan:  Heparin bolus 4000 units IV x 1 Heparin gtt 1200 units/hr Check a 6 hr heparin level Daily heparin level and CBC  Marguerite Jarboe, Rande Lawman 05/11/2022,3:55 PM

## 2022-05-11 NOTE — Progress Notes (Signed)
Notified by Dr. Allena Katz, MD to place verbal order for Troponin now and 2 hours after. If trending up start pt on heparin. Otherwise Stress test tomorrow.

## 2022-05-11 NOTE — Assessment & Plan Note (Signed)
Presented with epigastric pain and elevated troponin.  However symptoms correlate more with GI source and possible duodenal ulcer.  There is also question of whether he had coffee-ground emesis. -will start IV PPI -carafate TID   Will continue to trend troponin overnight and if there is significant delta change will start IV heparin and cardiology will follow with stress test in the morning.If troponins flatten, will need GI consult for endoscopy.  -keep NPO past midnight

## 2022-05-11 NOTE — ED Triage Notes (Signed)
Patient c/o severe abdominal pain starting 1am. Woke up from sleep. N/v. Mid epigastric. (Appendix removed as a child) Similar episode reported on may 6. Subsided on own.

## 2022-05-11 NOTE — ED Notes (Addendum)
Handoff report given Poonam RN on 6E at [facility]

## 2022-05-11 NOTE — H&P (Signed)
History and Physical    Patient: Benjamin Riley [id number] DOB: June 27, 1961 DOA: 05/11/2022 DOS: the patient was seen and examined on 05/12/2022 PCP: [name], MD  Patient coming from: Home  Chief Complaint:  Chief Complaint  Patient presents with   Abdominal Pain   HPI: Benjamin Riley is a 61 y.o. male with medical history significant of CAD s/p stent LAD, Type 2 diabetes, HTN, and obesity who presents as a transfer from outside ED for epigastric pain and elevated troponin.  Patient reports severe epigastric pain about 1 AM last evening that woke him up out of his sleep.  Then started to have persistent nausea and vomiting up until this afternoon.  He reports seeing some brown emesis but difficult to know whether it was blood since he had barbecue last evening.  He denies any diarrhea or constipation.  He has history of appendectomy and no other GI symptoms.  He takes daily aspirin and only occasional ibuprofen use.  No new foods.  No recent stressors.  No aggravating or alleviating factors.  No change with food. No chest pain or shortness of breath.  Reports having cardiac stent placed approximately 12 years ago.  Had issues with angina in 2018 with left heart cath showing single-vessel CAD with patent stent in the proximal LAD and moderate stenosis in the proximal/mid LAD involving the first and second diagonal branch.  Moderate RCA stenosis.  Aggressive medical therapy was recommended at that time.   In the ED, he was afebrile and normotensive with heart rate in the 50s on room air.  Leukocytosis of 15.6, no anemia.  CMP otherwise unremarkable.  Lipase is negative.  Troponin was elevated at 26 and then 30.  EKG with LVH but no significant ST or T wave abnormalities.  CT of the abdomen pelvis showed distal colonic diverticulosis without evidence of diverticulitis.  Probable bilateral inguinal hernias but no other intra-abdominal processes.  ED PA discussed with Dr. Rosemary Holms  with cardiology and given his symptoms with elevated troponin.  Advised trending troponin and they will see in consultation in the morning.  Review of Systems: As mentioned in the history of present illness. All other systems reviewed and are negative. Past Medical History:  Diagnosis Date   Coronary artery disease    Diabetes mellitus without complication (HCC)    Myocardial infarct [facility]    Past Surgical History:  Procedure Laterality Date   BACK SURGERY     CARDIAC CATHETERIZATION     CORONARY ANGIOPLASTY     CORONARY STENT PLACEMENT     LEFT HEART CATH AND CORONARY ANGIOGRAPHY N/A 08/24/2017   Procedure: LEFT HEART CATH AND CORONARY ANGIOGRAPHY;  Surgeon: Tonny Bollman, MD;  Location: [facility] INVASIVE CV LAB;  Service: Cardiovascular;  Laterality: N/A;   SINUS SURGERY WITH INSTATRAK     Social History:  reports that he has never smoked. He has never used smokeless tobacco. He reports that he does not drink alcohol and does not use drugs.  Allergies  Allergen Reactions   Lisinopril Other (See Comments)    unknown   Penicillins Other (See Comments)    unknown   Statins Other (See Comments)    Leg cramps with simvastatin and atorvastatin     Family History  Problem Relation Age of Onset   Stroke Mother    Macular degeneration Mother    Thyroid cancer Mother    Heart disease Father    Heart disease Brother     Prior to Admission medications  Medication Sig Start Date End Date Taking? Authorizing Provider  Ascorbic Acid (VITAMIN C) 1000 MG tablet Take 1,000 mg by mouth daily.   Yes [provider]  aspirin EC 81 MG tablet Take 81 mg by mouth daily.   Yes [provider]  Cholecalciferol (VITAMIN D3) 2000 units capsule Take 2,000 Units by mouth daily.   Yes [provider]  Coenzyme Q10 (COQ10) 100 MG CAPS Take 100 mg by mouth daily.   Yes [provider]  Cyanocobalamin 2000 MCG TBCR Take 2,000 mcg by mouth daily.   Yes [provider]  Evolocumab (REPATHA SURECLICK) 140 MG/ML SOAJ INJECT 140 MG  UNDER THE SKIN EVERY 2 WEEKS AS DIRECTED Patient taking differently: Inject 140 mg into the muscle every 14 (fourteen) days. 06/04/21  Yes Patwardhan, Manish J, MD  ezetimibe (ZETIA) 10 MG tablet Take 10 mg by mouth daily.   Yes [provider]  glipiZIDE (GLUCOTROL) 10 MG tablet Take 10 mg by mouth daily before breakfast. 07/17/17  Yes [provider]  JARDIANCE 10 MG TABS tablet Take 10 mg by mouth daily. 07/10/17  Yes [provider]  loratadine (CLARITIN) 10 MG tablet Take 10 mg by mouth daily.   Yes [provider]  losartan (COZAAR) 50 MG tablet Take 1 tablet (50 mg total) by mouth daily. 04/12/22  Yes Patwardhan, Manish J, MD  metFORMIN (GLUCOPHAGE-XR) 500 MG 24 hr tablet Take 500 mg by mouth 2 (two) times daily. 07/17/17  Yes [provider]  nitroGLYCERIN (NITROSTAT) 0.4 MG SL tablet Place 1 tablet (0.4 mg total) under the tongue every 5 (five) minutes as needed for chest pain. 08/24/17  Yes [name], MD  pioglitazone (ACTOS) 30 MG tablet Take 30 mg by mouth daily. 07/17/17  Yes [provider]  Semaglutide, 2 MG/DOSE, (OZEMPIC, 2 MG/DOSE,) 8 MG/3ML SOPN Inject 2 mg into the skin once a week. Sundays   Yes [provider]    Physical Exam: Vitals:   05/11/22 1734 05/11/22 1819 05/11/22 2104 05/12/22 0024  BP: 131/69 127/63 (!) 124/58 (!) 116/58  Pulse: 67 72 64 76  Resp: 20 20 15 14   Temp: 98.2 F (36.8 C) 97.9 F (36.6 C) 98.9 F (37.2 C) 98.8 F (37.1 C)  TempSrc:  Oral Oral Oral  SpO2: 97% 97% 98% 98%  Weight:      Height:      Constitutional: NAD, calm, comfortable, elderly male.  The stated age laying flat in bed Eyes: lids and conjunctivae normal ENMT: Mucous membranes are moist.  Neck: normal, supple Respiratory: clear to auscultation bilaterally, no wheezing, no crackles. Normal respiratory effort. Cardiovascular: Regular rate and  rhythm, no murmurs / rubs / gallops. No extremity edema. 2+ pedal pulses. No carotid bruits.  Abdomen: Focal area of pain between epigastric and RUQ quadrant with no guarding, rebound tenderness or rigidity.  Positive bowel sounds throughout. Musculoskeletal: no clubbing / cyanosis. No joint deformity upper and lower extremities. Good ROM, no contractures. Normal muscle tone.  Skin: no rashes, lesions, ulcers. No induration Neurologic: CN 2-12 grossly intact.Strength 5/5 in all 4.  Psychiatric: Normal judgment and insight. Alert and oriented x 3. Normal mood. Data Reviewed:  See HPI  Assessment and Plan: * Epigastric pain Presented with epigastric pain and elevated troponin.  However symptoms correlate more with GI source and possible duodenal ulcer.  There is also question of whether he had coffee-ground emesis. -will start IV PPI -carafate TID   Will  continue to trend troponin overnight and if there is significant delta change will start IV heparin and cardiology will follow with stress test in the morning.If troponins flatten, will need GI consult for endoscopy.  -keep NPO past midnight  Essential hypertension Continue losartan  Status post coronary artery stent placement Remote hx of stent to LAD -Had issues with angina in 2018 with left heart cath showing single-vessel CAD with patent stent in the proximal LAD and moderate stenosis in the proximal/mid LAD involving the first and second diagonal branch.  Moderate RCA stenosis.  Aggressive medical therapy was recommended at that time. -Continue to trend elevated troponin as above  Mixed hyperlipidemia Continue Zetia -Also on Repatha outpatient  Diabetes mellitus type 2, noninsulin dependent (HCC) HbA1C 7.6% (12/2021). Goal <7% -place on sensitive SSI      Advance Care Planning:   Code Status: Full Code   Consults: cardiology  Family Communication: Discussed with wife at bedside  Severity of Illness: The appropriate  patient status for this patient is OBSERVATION. Observation status is judged to be reasonable and necessary in order to provide the required intensity of service to ensure the patient's safety. The patient's presenting symptoms, physical exam findings, and initial radiographic and laboratory data in the context of their medical condition is felt to place them at decreased risk for further clinical deterioration. Furthermore, it is anticipated that the patient will be medically stable for discharge from the hospital within 2 midnights of admission.   Author: Anselm Junglinghing T Imogen Maddalena, DO 05/12/2022 2:30 AM  For on call review [email or web address].

## 2022-05-11 NOTE — Assessment & Plan Note (Addendum)
Remote hx of stent to LAD -Had issues with angina in 2018 with left heart cath showing single-vessel CAD with patent stent in the proximal LAD and moderate stenosis in the proximal/mid LAD involving the first and second diagonal branch.  Moderate RCA stenosis.  Aggressive medical therapy was recommended at that time. -Continue to trend elevated troponin as above

## 2022-05-11 NOTE — ED Notes (Signed)
Handoff report given to [facility] 

## 2022-05-12 ENCOUNTER — Observation Stay (HOSPITAL_COMMUNITY): Payer: No Typology Code available for payment source

## 2022-05-12 DIAGNOSIS — R1013 Epigastric pain: Secondary | ICD-10-CM | POA: Diagnosis not present

## 2022-05-12 DIAGNOSIS — K8020 Calculus of gallbladder without cholecystitis without obstruction: Secondary | ICD-10-CM | POA: Diagnosis not present

## 2022-05-12 DIAGNOSIS — R112 Nausea with vomiting, unspecified: Secondary | ICD-10-CM

## 2022-05-12 LAB — CBC
HCT: 37.2 % — ABNORMAL LOW (ref 39.0–52.0)
Hemoglobin: 12.3 g/dL — ABNORMAL LOW (ref 13.0–17.0)
MCH: 32.3 pg (ref 26.0–34.0)
MCHC: 33.1 g/dL (ref 30.0–36.0)
MCV: 97.6 fL (ref 80.0–100.0)
Platelets: 306 10*3/uL (ref 150–400)
RBC: 3.81 MIL/uL — ABNORMAL LOW (ref 4.22–5.81)
RDW: 13.7 % (ref 11.5–15.5)
WBC: 16.9 10*3/uL — ABNORMAL HIGH (ref 4.0–10.5)
nRBC: 0 % (ref 0.0–0.2)

## 2022-05-12 LAB — GLUCOSE, CAPILLARY
Glucose-Capillary: 179 mg/dL — ABNORMAL HIGH (ref 70–99)
Glucose-Capillary: 113 mg/dL — ABNORMAL HIGH (ref 70–99)
Glucose-Capillary: 128 mg/dL — ABNORMAL HIGH (ref 70–99)
Glucose-Capillary: 162 mg/dL — ABNORMAL HIGH (ref 70–99)

## 2022-05-12 LAB — TROPONIN I (HIGH SENSITIVITY): Troponin I (High Sensitivity): 27 ng/L — ABNORMAL HIGH (ref <18)

## 2022-05-12 NOTE — Progress Notes (Signed)
Informed T. Opyd, MD regarding the trop trends.

## 2022-05-12 NOTE — Progress Notes (Signed)
Not ACS. Nonspecific trop elevation. No plans for any ischemic workup this admissionn. Suspect GI etiology for epigastric pain. Okay to hold Aspirin for now pending GI workup, whether inpatient or outpatient. Consider PPI. Will arrange outpatient f/u in 4 weeks. Full note to follow.   Elder Negus, MD Pager: [phone] Office: [phone]

## 2022-05-12 NOTE — Consult Note (Signed)
Referring Provider: Dr. Dartha Lodge Primary Care Physician:  [name], MD Primary Gastroenterologist: Gentry Fitz  Reason for Consultation:  N/V, epigastric pain   HPI: Benjamin Riley is a 61 y.o. male with past medical history of hypertension, hyperlipidemia, coronary artery disease s/p MI s/p LAD stent 12 years ago.   He awakened around 1 AM on 05/11/2022 with epigastric pain with nausea and vomiting.  He reported vomiting 12 times.  Emesis consisted of partially digested food and the last 5 episodes consisted of brown liquid.  No frank hematemesis.  He presented to [facility] ED 05/11/2022 for further evaluation.  Labs in the ED showed a WBC count of 15.6.  Hemoglobin 13.4.  Platelet 372.  Glucose 175.  BUN 15.  Creatinine 0.94.  Normal LFTs.  Lipase 24.  Troponin 26 - 30 28. CTAP with contrast identified a 8 mm calcified gallstone otherwise the gallbladder and liver were normal.  Distal colonic diverticulosis without evidence of diverticulitis, probable bilateral inguinal hernias containing fat and prostate enlargement were noted.  He was evaluated by his cardiologist Dr. Rosemary Holms who assessed his EKG was stable with no acute ischemic changes. No concern  for angina or NSTEMI. A GI consult was requested to further evaluate his nausea, vomiting and epigastric pain.  He had a similar episode of awakening in the middle of the night with nausea, vomiting and epigastric pain approximately 3 to 4 weeks ago which resolved within 24 hours.   No further vomiting since arriving to the ED.  His epigastric pain has decreased but he has generalized upper abdominal soreness from vomiting.  He endorses having mild heartburn twice monthly for the past 6 months for which he takes Tums with relief.  No dysphagia.  No prior history of ulcers.  Never had an EGD.  He drinks 5 to 6 cups of coffee daily.  No alcohol use.  He takes ASA 81 mg daily.  No other NSAIDs.  He passes a normal brown formed bowel  movement daily.  No rectal bleeding or black stools.  He underwent a colonoscopy approximately 10 years ago while living in [place] which she reported was normal.  No known family history of esophageal, gastric or colon cancer.  Past Medical History:  Diagnosis Date   Coronary artery disease    Diabetes mellitus without complication (HCC)    Myocardial infarct [facility])     Past Surgical History:  Procedure Laterality Date   BACK SURGERY     CARDIAC CATHETERIZATION     CORONARY ANGIOPLASTY     CORONARY STENT PLACEMENT     LEFT HEART CATH AND CORONARY ANGIOGRAPHY N/A 08/24/2017   Procedure: LEFT HEART CATH AND CORONARY ANGIOGRAPHY;  Surgeon: Tonny Bollman, MD;  Location: [facility] INVASIVE CV LAB;  Service: Cardiovascular;  Laterality: N/A;   SINUS SURGERY WITH INSTATRAK      Prior to Admission medications   Medication Sig Start Date End Date Taking? Authorizing Provider  Ascorbic Acid (VITAMIN C) 1000 MG tablet Take 1,000 mg by mouth daily.   Yes [provider]  aspirin EC 81 MG tablet Take 81 mg by mouth daily.   Yes [provider]  Cholecalciferol (VITAMIN D3) 2000 units capsule Take 2,000 Units by mouth daily.   Yes [provider]  Coenzyme Q10 (COQ10) 100 MG CAPS Take 100 mg by mouth daily.   Yes [provider]  Cyanocobalamin 2000 MCG TBCR Take 2,000 mcg by mouth daily.   Yes [provider]  Evolocumab (REPATHA SURECLICK) 140 MG/ML SOAJ INJECT 140 MG  UNDER THE SKIN EVERY 2 WEEKS AS DIRECTED Patient taking differently: Inject 140 mg into the muscle every 14 (fourteen) days. 06/04/21  Yes Patwardhan, Manish J, MD  ezetimibe (ZETIA) 10 MG tablet Take 10 mg by mouth daily.   Yes [provider]  glipiZIDE (GLUCOTROL) 10 MG tablet Take 10 mg by mouth daily before breakfast. 07/17/17  Yes [provider]  JARDIANCE 10 MG TABS tablet Take 10 mg by mouth daily. 07/10/17  Yes [provider]  loratadine (CLARITIN) 10 MG  tablet Take 10 mg by mouth daily.   Yes [provider]  losartan (COZAAR) 50 MG tablet Take 1 tablet (50 mg total) by mouth daily. 04/12/22  Yes Patwardhan, Manish J, MD  metFORMIN (GLUCOPHAGE-XR) 500 MG 24 hr tablet Take 500 mg by mouth 2 (two) times daily. 07/17/17  Yes [provider]  nitroGLYCERIN (NITROSTAT) 0.4 MG SL tablet Place 1 tablet (0.4 mg total) under the tongue every 5 (five) minutes as needed for chest pain. 08/24/17  Yes [name], MD  pioglitazone (ACTOS) 30 MG tablet Take 30 mg by mouth daily. 07/17/17  Yes [provider]  Semaglutide, 2 MG/DOSE, (OZEMPIC, 2 MG/DOSE,) 8 MG/3ML SOPN Inject 2 mg into the skin once a week. Sundays   Yes [provider]    Current Facility-Administered Medications  Medication Dose Route Frequency Provider Last Rate Last Admin   ascorbic acid (VITAMIN C) tablet 1,000 mg  1,000 mg Oral Daily Tu, Ching T, DO       aspirin EC tablet 81 mg  81 mg Oral Daily Tu, Ching T, DO       cholecalciferol (VITAMIN D3) tablet 2,000 Units  2,000 Units Oral Daily Tu, Ching T, DO   2,000 Units at 05/12/22 1033   ezetimibe (ZETIA) tablet 10 mg  10 mg Oral Daily Tu, Ching T, DO   10 mg at 05/12/22 1033   HYDROmorphone (DILAUDID) injection 1 mg  1 mg Intravenous Q3H PRN Tu, Ching T, DO       insulin aspart (novoLOG) injection 0-9 Units  0-9 Units Subcutaneous TID WC Tu, Ching T, DO   2 Units at 05/12/22 0937   loratadine (CLARITIN) tablet 10 mg  10 mg Oral Daily Tu, Ching T, DO   10 mg at 05/12/22 1034   losartan (COZAAR) tablet 50 mg  50 mg Oral Daily Tu, Ching T, DO   50 mg at 05/12/22 1034   morphine (PF) 2 MG/ML injection 2 mg  2 mg Intravenous Q3H PRN Tu, Ching T, DO       nitroGLYCERIN (NITROSTAT) SL tablet 0.4 mg  0.4 mg Sublingual Q5 min PRN Theron Arista, PA-C   0.4 mg at 05/11/22 1626   ondansetron (ZOFRAN) injection 4 mg  4 mg Intravenous Q6H PRN Tu, Ching T, DO       pantoprazole (PROTONIX) injection 40 mg  40 mg  Intravenous QHS Tu, Ching T, DO   40 mg at 05/11/22 2243   sucralfate (CARAFATE) 1 GM/10ML suspension 1 g  1 g Oral TID WC & HS Tu, Ching T, DO   1 g at 05/12/22 1249   vitamin B-12 (CYANOCOBALAMIN) tablet 2,000 mcg  2,000 mcg Oral Daily Tu, Ching T, DO   2,000 mcg at 05/12/22 1035    Allergies as of 05/11/2022 - Review Complete 05/11/2022  Allergen Reaction Noted   Lisinopril Other (See Comments) 02/18/2021  Penicillins Other (See Comments) 02/18/2021   Statins Other (See Comments) 03/29/2017    Family History  Problem Relation Age of Onset   Stroke Mother    Macular degeneration Mother    Thyroid cancer Mother    Heart disease Father    Heart disease Brother     Social History   Socioeconomic History   Marital status: Married    Spouse name: Not on file   Number of children: Not on file   Years of education: Not on file   Highest education level: Not on file  Occupational History   Not on file  Tobacco Use   Smoking status: Never   Smokeless tobacco: Never  Vaping Use   Vaping Use: Never used  Substance and Sexual Activity   Alcohol use: No   Drug use: No   Sexual activity: Not on file  Other Topics Concern   Not on file  Social History Narrative   Not on file   Social Determinants of Health   Financial Resource Strain: Not on file  Food Insecurity: Not on file  Transportation Needs: Not on file  Physical Activity: Not on file  Stress: Not on file  Social Connections: Not on file  Intimate Partner Violence: Not on file    Review of Systems: Gen: Denies fever, sweats or chills. No weight loss.  CV: Denies chest pain, palpitations or edema. Resp: Denies cough, shortness of breath of hemoptysis.  GI: See HPI  GU : Denies urinary burning, blood in urine, increased urinary frequency or incontinence. MS: Denies joint pain, muscles aches or weakness. Derm: Denies rash, itchiness, skin lesions or unhealing ulcers. Psych: Denies depression, anxiety or memory  loss. Heme: Denies easy bruising, bleeding. Neuro:  Denies headaches, dizziness or paresthesias. Endo:  Denies any problems with DM, thyroid or adrenal function.  Physical Exam: Vital signs in last 24 hours: Temp:  [97.9 F (36.6 C)-99.4 F (37.4 C)] 99.4 F (37.4 C) (06/01 1152) Pulse Rate:  [56-93] 79 (06/01 0630) Resp:  [12-20] 16 (06/01 0630) BP: (116-151)/(58-76) 116/59 (06/01 0630) SpO2:  [92 %-100 %] 98 % (06/01 0630) Last BM Date : 05/11/22 General:  Alert 61 year old male in no acute distress Head:  Normocephalic and atraumatic. Eyes:  No scleral icterus. Conjunctiva pink. Ears:  Normal auditory acuity. Nose:  No deformity, discharge or lesions. Mouth:  Dentition intact. No ulcers or lesions.  Neck:  Supple. No lymphadenopathy or thyromegaly.  Lungs: Sounds clear, mildly decreased in the bases. Heart: Regular rate and rhythm, no murmurs. Abdomen: Soft, nondistended.  Mild epigastric tenderness without rebound or guarding.  Positive bowel sounds to all 4 quadrants.  No palpable mass. Rectal: Deferred. Musculoskeletal:  Symmetrical without gross deformities.  Pulses:  Normal pulses noted. Extremities:  Without clubbing or edema. Neurologic:  Alert and  oriented x 4. No focal deficits.  Skin:  Intact without significant lesions or rashes. Psych:  Alert and cooperative. Normal mood and affect.  Intake/Output from previous day: 05/31 0701 - 06/01 0700 In: 1795.4 [P.O.:240; I.V.:580.4; IV Piggyback:975] Out: 450 [Urine:450] Intake/Output this shift: Total I/O In: 180 [P.O.:180] Out: -   Lab Results: Recent Labs    05/11/22 1257 05/12/22 0218  WBC 15.6* 16.9*  HGB 13.4 12.3*  HCT 40.3 37.2*  PLT 372 306   BMET Recent Labs    05/11/22 1257  NA 140  K 4.3  CL 99  CO2 26  GLUCOSE 175*  BUN 15  CREATININE 0.94  CALCIUM 10.2  LFT Recent Labs    05/11/22 1257  PROT 7.8  ALBUMIN 4.6  AST 18  ALT 20  ALKPHOS 90  BILITOT 0.4   PT/INR No results  for input(s): LABPROT, INR in the last 72 hours. Hepatitis Panel No results for input(s): HEPBSAG, HCVAB, HEPAIGM, HEPBIGM in the last 72 hours.    Studies/Results: CT Abdomen Pelvis W Contrast  Result Date: 05/11/2022 CLINICAL DATA:  Acute mid abdominal pain since early this morning, nausea, vomiting. Past history of MI, diabetes mellitus, appendectomy EXAM: CT ABDOMEN AND PELVIS WITH CONTRAST TECHNIQUE: Multidetector CT imaging of the abdomen and pelvis was performed using the standard protocol following bolus administration of intravenous contrast. RADIATION DOSE REDUCTION: This exam was performed according to the departmental dose-optimization program which includes automated exposure control, adjustment of the mA and/or kV according to patient size and/or use of iterative reconstruction technique. CONTRAST:  76mL OMNIPAQUE IOHEXOL 300 MG/ML SOLN IV. No oral contrast. COMPARISON:  None FINDINGS: Lower chest: Minimal dependent atelectasis posterior RIGHT lower lobe. Hepatobiliary: 8 mm calcified gallstone in gallbladder. Gallbladder and liver otherwise normal appearance. Pancreas: Normal appearance Spleen: Normal appearance Adrenals/Urinary Tract: Adrenal glands normal appearance. RIGHT renal cyst 3.2 cm diameter; no follow-up imaging recommended. Kidneys, ureters, and bladder otherwise normal appearance. Stomach/Bowel: Appendix surgically absent by history. Diverticulosis of descending and sigmoid colon without evidence of diverticulitis. Rectum under distended, unable to exclude wall thickening in this setting. Duodenal diverticulum at cranial aspect of third portion. Remaining bowel loops unremarkable. Stomach normal appearance. Vascular/Lymphatic: Atherosclerotic calcifications aorta and iliac arteries without aneurysm. No adenopathy. Reproductive: Mild prostatic enlargement. Seminal vesicles unremarkable Other: Probable BILATERAL inguinal hernias containing fat. No free air or free fluid.  Musculoskeletal: Mild degenerative disc disease changes lower lumbar spine. IMPRESSION: Distal colonic diverticulosis without evidence of diverticulitis. Cholelithiasis. Mild prostatic enlargement. Probable BILATERAL inguinal hernias containing fat. No acute intra-abdominal or intrapelvic process identified. Aortic Atherosclerosis (ICD10-I70.0). Electronically Signed   By: Ulyses Southward M.D.   On: 05/11/2022 14:18   DG Chest Port 1 View  Result Date: 05/11/2022 CLINICAL DATA:  Upper abdominal pain with vomiting since 1 a.m. EXAM: PORTABLE CHEST 1 VIEW COMPARISON:  08/22/2017 FINDINGS: Numerous leads and wires project over the chest. Midline trachea. Borderline cardiomegaly. Mediastinal contours otherwise within normal limits. No pleural effusion or pneumothorax. Clear lungs. IMPRESSION: No acute cardiopulmonary disease. Electronically Signed   By: Jeronimo Greaves M.D.   On: 05/11/2022 14:55    IMPRESSION/PLAN:  14) 61 year old male with mild reflux symptoms x 6 months admitted to the hospital with nausea, vomiting and epigastric pain which awakened him from sleep at 1 AM on 05/11/2022.  Similar episode N/V and epigastric pain 3 to 4 weeks ago. Normal LFTs and lipase level.  CTAP showed a calcified gallstone in the gallbladder without evidence of acute cholecystitis or biliary ductal dilatation.  Afebrile.  Hemodynamically stable. -RUQ sonogram to further evaluate the gallbladder for gallstones -CMP in a.m. -Clear liquid diet -Pantoprazole 40 mg p.o. daily -Consider EGD during this hospitalization, await further recommendations per Dr. Rhea Belton  2) Mild normocytic anemia, likely dilutional.  Admission hemoglobin 13.4 -> today Hg 12.3.  No overt GI bleeding.  3) Colon cancer screening.  Colonoscopy 10 years ago reported as normal. -Follow-up in our outpatient office to schedule screening colonoscopy  4) CAD s/p stent to the LAD 12 years ago  Arnaldo Natal  05/12/2022, 3:26PM

## 2022-05-12 NOTE — H&P (View-Only) (Signed)
Referring Provider: Dr. Dartha Lodge Primary Care Physician:  [name], MD Primary Gastroenterologist: Gentry Fitz  Reason for Consultation:  N/V, epigastric pain   HPI: Benjamin Riley is a 61 y.o. male with past medical history of hypertension, hyperlipidemia, coronary artery disease s/p MI s/p LAD stent 12 years ago.   He awakened around 1 AM on 05/11/2022 with epigastric pain with nausea and vomiting.  He reported vomiting 12 times.  Emesis consisted of partially digested food and the last 5 episodes consisted of brown liquid.  No frank hematemesis.  He presented to [facility] ED 05/11/2022 for further evaluation.  Labs in the ED showed a WBC count of 15.6.  Hemoglobin 13.4.  Platelet 372.  Glucose 175.  BUN 15.  Creatinine 0.94.  Normal LFTs.  Lipase 24.  Troponin 26 - 30 28. CTAP with contrast identified a 8 mm calcified gallstone otherwise the gallbladder and liver were normal.  Distal colonic diverticulosis without evidence of diverticulitis, probable bilateral inguinal hernias containing fat and prostate enlargement were noted.  He was evaluated by his cardiologist Dr. Rosemary Holms who assessed his EKG was stable with no acute ischemic changes. No concern  for angina or NSTEMI. A GI consult was requested to further evaluate his nausea, vomiting and epigastric pain.  He had a similar episode of awakening in the middle of the night with nausea, vomiting and epigastric pain approximately 3 to 4 weeks ago which resolved within 24 hours.   No further vomiting since arriving to the ED.  His epigastric pain has decreased but he has generalized upper abdominal soreness from vomiting.  He endorses having mild heartburn twice monthly for the past 6 months for which he takes Tums with relief.  No dysphagia.  No prior history of ulcers.  Never had an EGD.  He drinks 5 to 6 cups of coffee daily.  No alcohol use.  He takes ASA 81 mg daily.  No other NSAIDs.  He passes a normal brown formed bowel  movement daily.  No rectal bleeding or black stools.  He underwent a colonoscopy approximately 10 years ago while living in [place] which she reported was normal.  No known family history of esophageal, gastric or colon cancer.  Past Medical History:  Diagnosis Date   Coronary artery disease    Diabetes mellitus without complication (HCC)    Myocardial infarct [facility])     Past Surgical History:  Procedure Laterality Date   BACK SURGERY     CARDIAC CATHETERIZATION     CORONARY ANGIOPLASTY     CORONARY STENT PLACEMENT     LEFT HEART CATH AND CORONARY ANGIOGRAPHY N/A 08/24/2017   Procedure: LEFT HEART CATH AND CORONARY ANGIOGRAPHY;  Surgeon: Tonny Bollman, MD;  Location: [facility] INVASIVE CV LAB;  Service: Cardiovascular;  Laterality: N/A;   SINUS SURGERY WITH INSTATRAK      Prior to Admission medications   Medication Sig Start Date End Date Taking? Authorizing Provider  Ascorbic Acid (VITAMIN C) 1000 MG tablet Take 1,000 mg by mouth daily.   Yes [provider]  aspirin EC 81 MG tablet Take 81 mg by mouth daily.   Yes [provider]  Cholecalciferol (VITAMIN D3) 2000 units capsule Take 2,000 Units by mouth daily.   Yes [provider]  Coenzyme Q10 (COQ10) 100 MG CAPS Take 100 mg by mouth daily.   Yes [provider]  Cyanocobalamin 2000 MCG TBCR Take 2,000 mcg by mouth daily.   Yes [provider]  Evolocumab (REPATHA SURECLICK) 140 MG/ML SOAJ INJECT 140 MG  UNDER THE SKIN EVERY 2 WEEKS AS DIRECTED Patient taking differently: Inject 140 mg into the muscle every 14 (fourteen) days. 06/04/21  Yes Patwardhan, Manish J, MD  ezetimibe (ZETIA) 10 MG tablet Take 10 mg by mouth daily.   Yes [provider]  glipiZIDE (GLUCOTROL) 10 MG tablet Take 10 mg by mouth daily before breakfast. 07/17/17  Yes [provider]  JARDIANCE 10 MG TABS tablet Take 10 mg by mouth daily. 07/10/17  Yes [provider]  loratadine (CLARITIN) 10 MG  tablet Take 10 mg by mouth daily.   Yes [provider]  losartan (COZAAR) 50 MG tablet Take 1 tablet (50 mg total) by mouth daily. 04/12/22  Yes Patwardhan, Manish J, MD  metFORMIN (GLUCOPHAGE-XR) 500 MG 24 hr tablet Take 500 mg by mouth 2 (two) times daily. 07/17/17  Yes [provider]  nitroGLYCERIN (NITROSTAT) 0.4 MG SL tablet Place 1 tablet (0.4 mg total) under the tongue every 5 (five) minutes as needed for chest pain. 08/24/17  Yes Tilley, William S, MD  pioglitazone (ACTOS) 30 MG tablet Take 30 mg by mouth daily. 07/17/17  Yes [provider]  Semaglutide, 2 MG/DOSE, (OZEMPIC, 2 MG/DOSE,) 8 MG/3ML SOPN Inject 2 mg into the skin once a week. Sundays   Yes [provider]    Current Facility-Administered Medications  Medication Dose Route Frequency Provider Last Rate Last Admin   ascorbic acid (VITAMIN C) tablet 1,000 mg  1,000 mg Oral Daily Tu, Ching T, DO       aspirin EC tablet 81 mg  81 mg Oral Daily Tu, Ching T, DO       cholecalciferol (VITAMIN D3) tablet 2,000 Units  2,000 Units Oral Daily Tu, Ching T, DO   2,000 Units at 05/12/22 1033   ezetimibe (ZETIA) tablet 10 mg  10 mg Oral Daily Tu, Ching T, DO   10 mg at 05/12/22 1033   HYDROmorphone (DILAUDID) injection 1 mg  1 mg Intravenous Q3H PRN Tu, Ching T, DO       insulin aspart (novoLOG) injection 0-9 Units  0-9 Units Subcutaneous TID WC Tu, Ching T, DO   2 Units at 05/12/22 0937   loratadine (CLARITIN) tablet 10 mg  10 mg Oral Daily Tu, Ching T, DO   10 mg at 05/12/22 1034   losartan (COZAAR) tablet 50 mg  50 mg Oral Daily Tu, Ching T, DO   50 mg at 05/12/22 1034   morphine (PF) 2 MG/ML injection 2 mg  2 mg Intravenous Q3H PRN Tu, Ching T, DO       nitroGLYCERIN (NITROSTAT) SL tablet 0.4 mg  0.4 mg Sublingual Q5 min PRN Sage, Haley, PA-C   0.4 mg at 05/11/22 1626   ondansetron (ZOFRAN) injection 4 mg  4 mg Intravenous Q6H PRN Tu, Ching T, DO       pantoprazole (PROTONIX) injection 40 mg  40 mg  Intravenous QHS Tu, Ching T, DO   40 mg at 05/11/22 2243   sucralfate (CARAFATE) 1 GM/10ML suspension 1 g  1 g Oral TID WC & HS Tu, Ching T, DO   1 g at 05/12/22 1249   vitamin B-12 (CYANOCOBALAMIN) tablet 2,000 mcg  2,000 mcg Oral Daily Tu, Ching T, DO   2,000 mcg at 05/12/22 1035    Allergies as of 05/11/2022 - Review Complete 05/11/2022  Allergen Reaction Noted   Lisinopril Other (See Comments) 02/18/2021     Penicillins Other (See Comments) 02/18/2021   Statins Other (See Comments) 03/29/2017    Family History  Problem Relation Age of Onset   Stroke Mother    Macular degeneration Mother    Thyroid cancer Mother    Heart disease Father    Heart disease Brother     Social History   Socioeconomic History   Marital status: Married    Spouse name: Not on file   Number of children: Not on file   Years of education: Not on file   Highest education level: Not on file  Occupational History   Not on file  Tobacco Use   Smoking status: Never   Smokeless tobacco: Never  Vaping Use   Vaping Use: Never used  Substance and Sexual Activity   Alcohol use: No   Drug use: No   Sexual activity: Not on file  Other Topics Concern   Not on file  Social History Narrative   Not on file   Social Determinants of Health   Financial Resource Strain: Not on file  Food Insecurity: Not on file  Transportation Needs: Not on file  Physical Activity: Not on file  Stress: Not on file  Social Connections: Not on file  Intimate Partner Violence: Not on file    Review of Systems: Gen: Denies fever, sweats or chills. No weight loss.  CV: Denies chest pain, palpitations or edema. Resp: Denies cough, shortness of breath of hemoptysis.  GI: See HPI  GU : Denies urinary burning, blood in urine, increased urinary frequency or incontinence. MS: Denies joint pain, muscles aches or weakness. Derm: Denies rash, itchiness, skin lesions or unhealing ulcers. Psych: Denies depression, anxiety or memory  loss. Heme: Denies easy bruising, bleeding. Neuro:  Denies headaches, dizziness or paresthesias. Endo:  Denies any problems with DM, thyroid or adrenal function.  Physical Exam: Vital signs in last 24 hours: Temp:  [97.9 F (36.6 C)-99.4 F (37.4 C)] 99.4 F (37.4 C) (06/01 1152) Pulse Rate:  [56-93] 79 (06/01 0630) Resp:  [12-20] 16 (06/01 0630) BP: (116-151)/(58-76) 116/59 (06/01 0630) SpO2:  [92 %-100 %] 98 % (06/01 0630) Last BM Date : 05/11/22 General:  Alert 61 year old male in no acute distress Head:  Normocephalic and atraumatic. Eyes:  No scleral icterus. Conjunctiva pink. Ears:  Normal auditory acuity. Nose:  No deformity, discharge or lesions. Mouth:  Dentition intact. No ulcers or lesions.  Neck:  Supple. No lymphadenopathy or thyromegaly.  Lungs: Sounds clear, mildly decreased in the bases. Heart: Regular rate and rhythm, no murmurs. Abdomen: Soft, nondistended.  Mild epigastric tenderness without rebound or guarding.  Positive bowel sounds to all 4 quadrants.  No palpable mass. Rectal: Deferred. Musculoskeletal:  Symmetrical without gross deformities.  Pulses:  Normal pulses noted. Extremities:  Without clubbing or edema. Neurologic:  Alert and  oriented x 4. No focal deficits.  Skin:  Intact without significant lesions or rashes. Psych:  Alert and cooperative. Normal mood and affect.  Intake/Output from previous day: 05/31 0701 - 06/01 0700 In: 1795.4 [P.O.:240; I.V.:580.4; IV Piggyback:975] Out: 450 [Urine:450] Intake/Output this shift: Total I/O In: 180 [P.O.:180] Out: -   Lab Results: Recent Labs    05/11/22 1257 05/12/22 0218  WBC 15.6* 16.9*  HGB 13.4 12.3*  HCT 40.3 37.2*  PLT 372 306   BMET Recent Labs    05/11/22 1257  NA 140  K 4.3  CL 99  CO2 26  GLUCOSE 175*  BUN 15  CREATININE 0.94  CALCIUM 10.2  LFT Recent Labs    05/11/22 1257  PROT 7.8  ALBUMIN 4.6  AST 18  ALT 20  ALKPHOS 90  BILITOT 0.4   PT/INR No results  for input(s): LABPROT, INR in the last 72 hours. Hepatitis Panel No results for input(s): HEPBSAG, HCVAB, HEPAIGM, HEPBIGM in the last 72 hours.    Studies/Results: CT Abdomen Pelvis W Contrast  Result Date: 05/11/2022 CLINICAL DATA:  Acute mid abdominal pain since early this morning, nausea, vomiting. Past history of MI, diabetes mellitus, appendectomy EXAM: CT ABDOMEN AND PELVIS WITH CONTRAST TECHNIQUE: Multidetector CT imaging of the abdomen and pelvis was performed using the standard protocol following bolus administration of intravenous contrast. RADIATION DOSE REDUCTION: This exam was performed according to the departmental dose-optimization program which includes automated exposure control, adjustment of the mA and/or kV according to patient size and/or use of iterative reconstruction technique. CONTRAST:  76mL OMNIPAQUE IOHEXOL 300 MG/ML SOLN IV. No oral contrast. COMPARISON:  None FINDINGS: Lower chest: Minimal dependent atelectasis posterior RIGHT lower lobe. Hepatobiliary: 8 mm calcified gallstone in gallbladder. Gallbladder and liver otherwise normal appearance. Pancreas: Normal appearance Spleen: Normal appearance Adrenals/Urinary Tract: Adrenal glands normal appearance. RIGHT renal cyst 3.2 cm diameter; no follow-up imaging recommended. Kidneys, ureters, and bladder otherwise normal appearance. Stomach/Bowel: Appendix surgically absent by history. Diverticulosis of descending and sigmoid colon without evidence of diverticulitis. Rectum under distended, unable to exclude wall thickening in this setting. Duodenal diverticulum at cranial aspect of third portion. Remaining bowel loops unremarkable. Stomach normal appearance. Vascular/Lymphatic: Atherosclerotic calcifications aorta and iliac arteries without aneurysm. No adenopathy. Reproductive: Mild prostatic enlargement. Seminal vesicles unremarkable Other: Probable BILATERAL inguinal hernias containing fat. No free air or free fluid.  Musculoskeletal: Mild degenerative disc disease changes lower lumbar spine. IMPRESSION: Distal colonic diverticulosis without evidence of diverticulitis. Cholelithiasis. Mild prostatic enlargement. Probable BILATERAL inguinal hernias containing fat. No acute intra-abdominal or intrapelvic process identified. Aortic Atherosclerosis (ICD10-I70.0). Electronically Signed   By: Ulyses Southward M.D.   On: 05/11/2022 14:18   DG Chest Port 1 View  Result Date: 05/11/2022 CLINICAL DATA:  Upper abdominal pain with vomiting since 1 a.m. EXAM: PORTABLE CHEST 1 VIEW COMPARISON:  08/22/2017 FINDINGS: Numerous leads and wires project over the chest. Midline trachea. Borderline cardiomegaly. Mediastinal contours otherwise within normal limits. No pleural effusion or pneumothorax. Clear lungs. IMPRESSION: No acute cardiopulmonary disease. Electronically Signed   By: Jeronimo Greaves M.D.   On: 05/11/2022 14:55    IMPRESSION/PLAN:  14) 61 year old male with mild reflux symptoms x 6 months admitted to the hospital with nausea, vomiting and epigastric pain which awakened him from sleep at 1 AM on 05/11/2022.  Similar episode N/V and epigastric pain 3 to 4 weeks ago. Normal LFTs and lipase level.  CTAP showed a calcified gallstone in the gallbladder without evidence of acute cholecystitis or biliary ductal dilatation.  Afebrile.  Hemodynamically stable. -RUQ sonogram to further evaluate the gallbladder for gallstones -CMP in a.m. -Clear liquid diet -Pantoprazole 40 mg p.o. daily -Consider EGD during this hospitalization, await further recommendations per Dr. Rhea Belton  2) Mild normocytic anemia, likely dilutional.  Admission hemoglobin 13.4 -> today Hg 12.3.  No overt GI bleeding.  3) Colon cancer screening.  Colonoscopy 10 years ago reported as normal. -Follow-up in our outpatient office to schedule screening colonoscopy  4) CAD s/p stent to the LAD 12 years ago  Arnaldo Natal  05/12/2022, 3:26PM

## 2022-05-12 NOTE — Care Management (Signed)
  Transition of Care [facility]) Screening Note   Patient Details  Name: Benjamin Riley Date of Birth: August 21, 1961   Transition of Care [facility]) CM/SW Contact:    Gala Lewandowsky, RN Phone Number: 05/12/2022, 11:53 AM    Transition of Care Department [facility]) has reviewed the patient and no TOC needs have been identified at this time. Case Manager will continue to follow for transition of care needs as the patient progresses.

## 2022-05-12 NOTE — Plan of Care (Signed)
  Problem: Education: Goal: Knowledge of General Education information will improve Description: Including pain rating scale, medication(s)/side effects and non-pharmacologic comfort measures Outcome: Progressing   Problem: Health Behavior/Discharge Planning: Goal: Ability to manage health-related needs will improve Outcome: Progressing   Problem: Clinical Measurements: Goal: Ability to maintain clinical measurements within normal limits will improve Outcome: Progressing   Problem: Elimination: Goal: Will not experience complications related to bowel motility Outcome: Progressing   Problem: Pain Managment: Goal: General experience of comfort will improve Outcome: Progressing   

## 2022-05-12 NOTE — Progress Notes (Signed)
PROGRESS NOTE    Benjamin Riley  ZOX:[id number] DOB: Jan 28, 1961 DOA: 05/11/2022 PCP: Felicity Coyerhompson, Tyler S, MD  Outpatient Specialists:     Brief Narrative:  Admitted earlier today with epigastric pain.  Cardiology team is cleared patient.  GI team consulted.  05/12/2022: Patient seen alongside patient's wife.  Epigastric pain has improved.  Awaiting GI input.   Assessment & Plan:   Principal Problem:   Epigastric pain Active Problems:   Diabetes mellitus type 2, noninsulin dependent (HCC)   Mixed hyperlipidemia   Status post coronary artery stent placement   Obesity (BMI 30-39.9)   Essential hypertension   Elevated troponin   Epigastric pain Presented with epigastric pain and elevated troponin.  However symptoms correlate more with GI source and possible duodenal ulcer.  There is also question of whether he had coffee-ground emesis. -will start IV PPI -carafate TID   Will continue to trend troponin overnight and if there is significant delta change will start IV heparin and cardiology will follow with stress test in the morning.If troponins flatten, will need GI consult for endoscopy.  -keep NPO past midnight 05/12/2022: Epigastric pain has resolved.  GI team has been consulted.  Awaiting GI input.   Essential hypertension Continue losartan   Status post coronary artery stent placement Remote hx of stent to LAD -Had issues with angina in 2018 with left heart cath showing single-vessel CAD with patent stent in the proximal LAD and moderate stenosis in the proximal/mid LAD involving the first and second diagonal branch.  Moderate RCA stenosis.  Aggressive medical therapy was recommended at that time. -Continue to trend elevated troponin as above 05/12/2022: Patient has been cleared by the cardiology team.  Patient will follow with the cardiology team on discharge.  Troponin has not been impressive.   Mixed hyperlipidemia Continue Zetia -Also on Repatha outpatient   Diabetes  mellitus type 2, noninsulin dependent (HCC) HbA1C 7.6% (12/2021). Goal <7% -place on sensitive SSI   DVT prophylaxis: SCD Code Status: Full code Family Communication: Wife Disposition Plan: Likely home tomorrow   Consultants:  Cardiology and GI  Procedures:  None for now  Antimicrobials:  None   Subjective: Epigastric pain has resolved. No chest pain  Objective: Vitals:   05/11/22 2104 05/12/22 0024 05/12/22 0630 05/12/22 1152  BP: (!) 124/58 (!) 116/58 (!) 116/59   Pulse: 64 76 79   Resp: 15 14 16    Temp: 98.9 F (37.2 C) 98.8 F (37.1 C) 98.5 F (36.9 C) 99.4 F (37.4 C)  TempSrc: Oral Oral Oral   SpO2: 98% 98% 98%   Weight:      Height:        Intake/Output Summary (Last 24 hours) at 05/12/2022 1554 Last data filed at 05/12/2022 1000 Gross per 24 hour  Intake 1000.43 ml  Output 450 ml  Net 550.43 ml   Filed Weights   05/11/22 1251  Weight: 106.6 kg    Examination:  General exam: Appears calm and comfortable  Respiratory system: Clear to auscultation.  Cardiovascular system: S1 & S2.  Patient has systolic murmur.  Gastrointestinal system: Abdomen is soft and nontender.  Central nervous system: Alert and oriented. No focal neurological deficits. Extremities: No leg edema.   Data Reviewed: I have personally reviewed following labs and imaging studies  CBC: Recent Labs  Lab 05/11/22 1257 05/12/22 0218  WBC 15.6* 16.9*  HGB 13.4 12.3*  HCT 40.3 37.2*  MCV 95.3 97.6  PLT 372 306   Basic Metabolic Panel: Recent Labs  Lab 05/11/22 1257  NA 140  K 4.3  CL 99  CO2 26  GLUCOSE 175*  BUN 15  CREATININE 0.94  CALCIUM 10.2   GFR: Estimated Creatinine Clearance: 102.1 mL/min (by C-G formula based on SCr of 0.94 mg/dL). Liver Function Tests: Recent Labs  Lab 05/11/22 1257  AST 18  ALT 20  ALKPHOS 90  BILITOT 0.4  PROT 7.8  ALBUMIN 4.6   Recent Labs  Lab 05/11/22 1257  LIPASE 24   No results for input(s): AMMONIA in the last 168  hours. Coagulation Profile: No results for input(s): INR, PROTIME in the last 168 hours. Cardiac Enzymes: No results for input(s): CKTOTAL, CKMB, CKMBINDEX, TROPONINI in the last 168 hours. BNP (last 3 results) No results for input(s): PROBNP in the last 8760 hours. HbA1C: Recent Labs    05/11/22 2317  HGBA1C 7.5*   CBG: Recent Labs  Lab 05/11/22 2109 05/12/22 0850 05/12/22 1150  GLUCAP 173* 179* 113*   Lipid Profile: No results for input(s): CHOL, HDL, LDLCALC, TRIG, CHOLHDL, LDLDIRECT in the last 72 hours. Thyroid Function Tests: No results for input(s): TSH, T4TOTAL, FREET4, T3FREE, THYROIDAB in the last 72 hours. Anemia Panel: No results for input(s): VITAMINB12, FOLATE, FERRITIN, TIBC, IRON, RETICCTPCT in the last 72 hours. Urine analysis:    Component Value Date/Time   COLORURINE YELLOW 05/11/2022 1257   APPEARANCEUR CLEAR 05/11/2022 1257   LABSPEC >1.046 (H) 05/11/2022 1257   PHURINE 5.5 05/11/2022 1257   GLUCOSEU >1,000 (A) 05/11/2022 1257   HGBUR NEGATIVE 05/11/2022 1257   BILIRUBINUR NEGATIVE 05/11/2022 1257   KETONESUR 40 (A) 05/11/2022 1257   PROTEINUR TRACE (A) 05/11/2022 1257   NITRITE NEGATIVE 05/11/2022 1257   LEUKOCYTESUR NEGATIVE 05/11/2022 1257   Sepsis Labs: @LABRCNTIP (procalcitonin:4,lacticidven:4)  )No results found for this or any previous visit (from the past 240 hour(s)).       Radiology Studies: CT Abdomen Pelvis W Contrast  Result Date: 05/11/2022 CLINICAL DATA:  Acute mid abdominal pain since early this morning, nausea, vomiting. Past history of MI, diabetes mellitus, appendectomy EXAM: CT ABDOMEN AND PELVIS WITH CONTRAST TECHNIQUE: Multidetector CT imaging of the abdomen and pelvis was performed using the standard protocol following bolus administration of intravenous contrast. RADIATION DOSE REDUCTION: This exam was performed according to the departmental dose-optimization program which includes automated exposure control, adjustment  of the mA and/or kV according to patient size and/or use of iterative reconstruction technique. CONTRAST:  32mL OMNIPAQUE IOHEXOL 300 MG/ML SOLN IV. No oral contrast. COMPARISON:  None FINDINGS: Lower chest: Minimal dependent atelectasis posterior RIGHT lower lobe. Hepatobiliary: 8 mm calcified gallstone in gallbladder. Gallbladder and liver otherwise normal appearance. Pancreas: Normal appearance Spleen: Normal appearance Adrenals/Urinary Tract: Adrenal glands normal appearance. RIGHT renal cyst 3.2 cm diameter; no follow-up imaging recommended. Kidneys, ureters, and bladder otherwise normal appearance. Stomach/Bowel: Appendix surgically absent by history. Diverticulosis of descending and sigmoid colon without evidence of diverticulitis. Rectum under distended, unable to exclude wall thickening in this setting. Duodenal diverticulum at cranial aspect of third portion. Remaining bowel loops unremarkable. Stomach normal appearance. Vascular/Lymphatic: Atherosclerotic calcifications aorta and iliac arteries without aneurysm. No adenopathy. Reproductive: Mild prostatic enlargement. Seminal vesicles unremarkable Other: Probable BILATERAL inguinal hernias containing fat. No free air or free fluid. Musculoskeletal: Mild degenerative disc disease changes lower lumbar spine. IMPRESSION: Distal colonic diverticulosis without evidence of diverticulitis. Cholelithiasis. Mild prostatic enlargement. Probable BILATERAL inguinal hernias containing fat. No acute intra-abdominal or intrapelvic process identified. Aortic Atherosclerosis (ICD10-I70.0). Electronically Signed   By: 91m  Tyron Russell M.D.   On: 05/11/2022 14:18   DG Chest Port 1 View  Result Date: 05/11/2022 CLINICAL DATA:  Upper abdominal pain with vomiting since 1 a.m. EXAM: PORTABLE CHEST 1 VIEW COMPARISON:  08/22/2017 FINDINGS: Numerous leads and wires project over the chest. Midline trachea. Borderline cardiomegaly. Mediastinal contours otherwise within normal limits.  No pleural effusion or pneumothorax. Clear lungs. IMPRESSION: No acute cardiopulmonary disease. Electronically Signed   By: Jeronimo Greaves M.D.   On: 05/11/2022 14:55        Scheduled Meds:  vitamin C  1,000 mg Oral Daily   aspirin EC  81 mg Oral Daily   cholecalciferol  2,000 Units Oral Daily   ezetimibe  10 mg Oral Daily   insulin aspart  0-9 Units Subcutaneous TID WC   loratadine  10 mg Oral Daily   losartan  50 mg Oral Daily   pantoprazole (PROTONIX) IV  40 mg Intravenous QHS   sucralfate  1 g Oral TID WC & HS   vitamin B-12  2,000 mcg Oral Daily   Continuous Infusions:   LOS: 0 days    Time spent: 35 minutes    Berton Mount, MD  [facility] Pager #: [phone] 7PM-7AM contact night coverage as above

## 2022-05-13 ENCOUNTER — Encounter (HOSPITAL_COMMUNITY)
Admission: EM | Disposition: A | Discharge status: Home / Self Care | Payer: Self-pay | Source: Home / Self Care | Attending: Internal Medicine

## 2022-05-13 ENCOUNTER — Encounter (HOSPITAL_COMMUNITY): Payer: Self-pay | Admitting: Internal Medicine

## 2022-05-13 ENCOUNTER — Observation Stay (HOSPITAL_BASED_OUTPATIENT_CLINIC_OR_DEPARTMENT_OTHER): Payer: No Typology Code available for payment source | Admitting: Anesthesiology

## 2022-05-13 ENCOUNTER — Observation Stay (HOSPITAL_COMMUNITY): Payer: No Typology Code available for payment source | Admitting: Anesthesiology

## 2022-05-13 DIAGNOSIS — K5750 Diverticulosis of both small and large intestine without perforation or abscess without bleeding: Secondary | ICD-10-CM | POA: Diagnosis present

## 2022-05-13 DIAGNOSIS — Z7984 Long term (current) use of oral hypoglycemic drugs: Secondary | ICD-10-CM | POA: Diagnosis not present

## 2022-05-13 DIAGNOSIS — K226 Gastro-esophageal laceration-hemorrhage syndrome: Secondary | ICD-10-CM

## 2022-05-13 DIAGNOSIS — Z880 Allergy status to penicillin: Secondary | ICD-10-CM | POA: Diagnosis not present

## 2022-05-13 DIAGNOSIS — R778 Other specified abnormalities of plasma proteins: Secondary | ICD-10-CM | POA: Diagnosis present

## 2022-05-13 DIAGNOSIS — N400 Enlarged prostate without lower urinary tract symptoms: Secondary | ICD-10-CM | POA: Diagnosis present

## 2022-05-13 DIAGNOSIS — E669 Obesity, unspecified: Secondary | ICD-10-CM | POA: Diagnosis present

## 2022-05-13 DIAGNOSIS — Z823 Family history of stroke: Secondary | ICD-10-CM | POA: Diagnosis not present

## 2022-05-13 DIAGNOSIS — I252 Old myocardial infarction: Secondary | ICD-10-CM

## 2022-05-13 DIAGNOSIS — K82A1 Gangrene of gallbladder in cholecystitis: Secondary | ICD-10-CM | POA: Diagnosis present

## 2022-05-13 DIAGNOSIS — R1013 Epigastric pain: Secondary | ICD-10-CM | POA: Diagnosis present

## 2022-05-13 DIAGNOSIS — K810 Acute cholecystitis: Secondary | ICD-10-CM | POA: Diagnosis present

## 2022-05-13 DIAGNOSIS — Z808 Family history of malignant neoplasm of other organs or systems: Secondary | ICD-10-CM | POA: Diagnosis not present

## 2022-05-13 DIAGNOSIS — I25119 Atherosclerotic heart disease of native coronary artery with unspecified angina pectoris: Secondary | ICD-10-CM

## 2022-05-13 DIAGNOSIS — K2970 Gastritis, unspecified, without bleeding: Secondary | ICD-10-CM

## 2022-05-13 DIAGNOSIS — E119 Type 2 diabetes mellitus without complications: Secondary | ICD-10-CM | POA: Diagnosis present

## 2022-05-13 DIAGNOSIS — Z955 Presence of coronary angioplasty implant and graft: Secondary | ICD-10-CM | POA: Diagnosis not present

## 2022-05-13 DIAGNOSIS — I2510 Atherosclerotic heart disease of native coronary artery without angina pectoris: Secondary | ICD-10-CM | POA: Diagnosis present

## 2022-05-13 DIAGNOSIS — Z79899 Other long term (current) drug therapy: Secondary | ICD-10-CM | POA: Diagnosis not present

## 2022-05-13 DIAGNOSIS — Z6833 Body mass index (BMI) 33.0-33.9, adult: Secondary | ICD-10-CM | POA: Diagnosis not present

## 2022-05-13 DIAGNOSIS — K4020 Bilateral inguinal hernia, without obstruction or gangrene, not specified as recurrent: Secondary | ICD-10-CM | POA: Diagnosis present

## 2022-05-13 DIAGNOSIS — E782 Mixed hyperlipidemia: Secondary | ICD-10-CM | POA: Diagnosis present

## 2022-05-13 DIAGNOSIS — Z7982 Long term (current) use of aspirin: Secondary | ICD-10-CM | POA: Diagnosis not present

## 2022-05-13 DIAGNOSIS — Z8249 Family history of ischemic heart disease and other diseases of the circulatory system: Secondary | ICD-10-CM | POA: Diagnosis not present

## 2022-05-13 DIAGNOSIS — D649 Anemia, unspecified: Secondary | ICD-10-CM | POA: Diagnosis present

## 2022-05-13 DIAGNOSIS — I10 Essential (primary) hypertension: Secondary | ICD-10-CM | POA: Diagnosis present

## 2022-05-13 HISTORY — PX: BIOPSY: SHX5522

## 2022-05-13 HISTORY — PX: ESOPHAGOGASTRODUODENOSCOPY (EGD) WITH PROPOFOL: SHX5813

## 2022-05-13 LAB — HEPATIC FUNCTION PANEL
ALT: 15 U/L (ref 0–44)
AST: 16 U/L (ref 15–41)
Albumin: 3.2 g/dL — ABNORMAL LOW (ref 3.5–5.0)
Alkaline Phosphatase: 72 U/L (ref 38–126)
Bilirubin, Direct: 0.2 mg/dL (ref 0.0–0.2)
Indirect Bilirubin: 0.7 mg/dL (ref 0.3–0.9)
Total Bilirubin: 0.9 mg/dL (ref 0.3–1.2)
Total Protein: 5.8 g/dL — ABNORMAL LOW (ref 6.5–8.1)

## 2022-05-13 LAB — CBC WITH DIFFERENTIAL/PLATELET
Abs Immature Granulocytes: 0.12 10*3/uL — ABNORMAL HIGH (ref 0.00–0.07)
Basophils Absolute: 0.1 10*3/uL (ref 0.0–0.1)
Basophils Relative: 0 %
Eosinophils Absolute: 0 10*3/uL (ref 0.0–0.5)
Eosinophils Relative: 0 %
HCT: 35.2 % — ABNORMAL LOW (ref 39.0–52.0)
Hemoglobin: 11.5 g/dL — ABNORMAL LOW (ref 13.0–17.0)
Immature Granulocytes: 1 %
Lymphocytes Relative: 8 %
Lymphs Abs: 1.3 10*3/uL (ref 0.7–4.0)
MCH: 32 pg (ref 26.0–34.0)
MCHC: 32.7 g/dL (ref 30.0–36.0)
MCV: 98.1 fL (ref 80.0–100.0)
Monocytes Absolute: 1.4 10*3/uL — ABNORMAL HIGH (ref 0.1–1.0)
Monocytes Relative: 8 %
Neutro Abs: 13.3 10*3/uL — ABNORMAL HIGH (ref 1.7–7.7)
Neutrophils Relative %: 83 %
Platelets: 260 10*3/uL (ref 150–400)
RBC: 3.59 MIL/uL — ABNORMAL LOW (ref 4.22–5.81)
RDW: 13.7 % (ref 11.5–15.5)
WBC: 16.2 10*3/uL — ABNORMAL HIGH (ref 4.0–10.5)
nRBC: 0 % (ref 0.0–0.2)

## 2022-05-13 LAB — RENAL FUNCTION PANEL
Albumin: 3.2 g/dL — ABNORMAL LOW (ref 3.5–5.0)
Anion gap: 6 (ref 5–15)
BUN: 13 mg/dL (ref 6–20)
CO2: 24 mmol/L (ref 22–32)
Calcium: 8.6 mg/dL — ABNORMAL LOW (ref 8.9–10.3)
Chloride: 103 mmol/L (ref 98–111)
Creatinine, Ser: 0.95 mg/dL (ref 0.61–1.24)
GFR, Estimated: >60 mL/min (ref >60)
Glucose, Bld: 116 mg/dL — ABNORMAL HIGH (ref 70–99)
Phosphorus: 1.7 mg/dL — ABNORMAL LOW (ref 2.5–4.6)
Potassium: 3.7 mmol/L (ref 3.5–5.1)
Sodium: 133 mmol/L — ABNORMAL LOW (ref 135–145)

## 2022-05-13 LAB — GLUCOSE, CAPILLARY
Glucose-Capillary: 116 mg/dL — ABNORMAL HIGH (ref 70–99)
Glucose-Capillary: 123 mg/dL — ABNORMAL HIGH (ref 70–99)
Glucose-Capillary: 113 mg/dL — ABNORMAL HIGH (ref 70–99)
Glucose-Capillary: 121 mg/dL — ABNORMAL HIGH (ref 70–99)

## 2022-05-13 LAB — MAGNESIUM: Magnesium: 2.2 mg/dL (ref 1.7–2.4)

## 2022-05-13 SURGERY — ESOPHAGOGASTRODUODENOSCOPY (EGD) WITH PROPOFOL
Anesthesia: Monitor Anesthesia Care

## 2022-05-13 MED ORDER — PROPOFOL 500 MG/50ML IV EMUL
INTRAVENOUS | Status: DC | PRN
  Administered 2022-05-13: 150 ug/kg/min via INTRAVENOUS

## 2022-05-13 MED ORDER — PROPOFOL 10 MG/ML IV BOLUS
INTRAVENOUS | Status: DC | PRN
  Administered 2022-05-13 (×2): 20 mg via INTRAVENOUS
  Administered 2022-05-13: 40 mg via INTRAVENOUS

## 2022-05-13 MED ORDER — SODIUM CHLORIDE 0.9 % IV SOLN: 1.0000 g | INTRAVENOUS | Status: DC

## 2022-05-13 MED ORDER — SODIUM CHLORIDE 0.9 % IV SOLN: INTRAVENOUS | Status: DC

## 2022-05-13 MED ORDER — LACTATED RINGERS IV SOLN
INTRAVENOUS | Status: AC | PRN
  Administered 2022-05-13: 1000 mL via INTRAVENOUS

## 2022-05-13 MED ORDER — SODIUM CHLORIDE 0.9 % IV SOLN
2.0000 g | INTRAVENOUS | Status: DC
  Administered 2022-05-13: 2 g via INTRAVENOUS
  Filled 2022-05-13 (×2): qty 20

## 2022-05-13 MED ORDER — PANTOPRAZOLE SODIUM 40 MG PO TBEC
40.0000 mg | DELAYED_RELEASE_TABLET | Freq: Every day | ORAL | Status: DC
  Administered 2022-05-13 – 2022-05-15 (×3): 40 mg via ORAL
  Filled 2022-05-13 (×3): qty 1

## 2022-05-13 SURGICAL SUPPLY — 15 items

## 2022-05-13 NOTE — Op Note (Signed)
[facility] Patient Name: Benjamin HummerBert Worrell Procedure Date : 6/2/20[facility]23 MRN: [id number] Attending MD: Beverley FiedlerJay M Emojean Gertz , MD Date of Birth: Dec 27, 1960 CSN: [id number] Age: [age] Admit Type: Inpatient Procedure:                Upper GI endoscopy Indications:              Epigastric abdominal pain, Nausea with vomiting Providers:                Carie CaddyJay M. Rhea BeltonPyrtle, MD, Vicki MalletBrandy Grace, RN, Alan Ripperlae Houle                            Technician, Technician Referring MD:             [facility] Medicines:                Monitored Anesthesia Care Complications:            No immediate complications. Estimated Blood Loss:     Estimated blood loss was minimal. Procedure:                Pre-Anesthesia Assessment:                           - Prior to the procedure, a History and Physical                            was performed, and patient medications and                            allergies were reviewed. The patient's tolerance of                            previous anesthesia was also reviewed. The risks                            and benefits of the procedure and the sedation                            options and risks were discussed with the patient.                            All questions were answered, and informed consent                            was obtained. Prior Anticoagulants: The patient has                            taken no previous anticoagulant or antiplatelet                            agents. ASA Grade Assessment: II - A patient with                            mild systemic disease. After reviewing the risks  and benefits, the patient was deemed in                            satisfactory condition to undergo the procedure.                           After obtaining informed consent, the endoscope was                            passed under direct vision. Throughout the                            procedure, the patient's blood pressure, pulse,  and                            oxygen saturations were monitored continuously. The                            [id number]-H190 ([id number]) Olympus endoscope was introduced                            through the mouth, and advanced to the second part                            of duodenum. The upper GI endoscopy was                            accomplished without difficulty. The patient                            tolerated the procedure well. Scope In: Scope Out: Findings:      A 7 mm non-bleeding Mallory-Weiss tear was found.      The exam of the esophagus was otherwise normal.      Mild inflammation characterized by erythema was found in the gastric       fundus and in the gastric body. Biopsies were taken with a cold forceps       for histology and Helicobacter pylori testing.      The examined duodenum was normal. Impression:               - Mallory-Weiss tear (from recent vomiting).                           - Mild gastritis (possible related to retching).                            Biopsied to exclude H. Pylori. This is not the                            source of recent epigastric pain.                           - Normal examined duodenum. Moderate Sedation:      N/A Recommendation:           - Return  patient to hospital ward for ongoing care.                           - Advance diet as tolerated.                           - Continue present medications. Daily PPI x 1 month                            to help with healing of Mallory-Weiss tear.                           - Await pathology results.                           - Outpatient screening colonoscopy recommended 10                            years after last exam.                           - Surgical consult given gallstones and now 2                            episodes of biliary colic/symptomatic                            cholelithiasis. Procedure Code(s):        --- Professional ---                           [phone],  Esophagogastroduodenoscopy, flexible,                            transoral; with biopsy, single or multiple Diagnosis Code(s):        --- Professional ---                           K22.6, Gastro-esophageal laceration-hemorrhage                            syndrome                           K29.70, Gastritis, unspecified, without bleeding                           R10.13, Epigastric pain                           R11.2, Nausea with vomiting, unspecified CPT copyright 2019 American [employer] Association. All rights reserved. The codes documented in this report are preliminary and upon coder review may  be revised to meet current compliance requirements. Beverley Fiedler, MD 05/13/2022 11:21:24 AM This report has been signed electronically. Number of Addenda: 0

## 2022-05-13 NOTE — Procedures (Addendum)
I spoke to the wife after EGD regarding findings and treatment plans I called general surgery and they will see the patient today to consider laparoscopic cholecystectomy  Time provided for questions and answers and she thanked me for the call  GI will sign off but remain available.  I will notify him about gastric biopsy results when they are available.

## 2022-05-13 NOTE — Transfer of Care (Signed)
Immediate Anesthesia Transfer of Care Note  Patient: Benjamin Riley  Procedure(s) Performed: ESOPHAGOGASTRODUODENOSCOPY (EGD) WITH PROPOFOL BIOPSY  Patient Location: PACU  Anesthesia Type:MAC  Level of Consciousness: drowsy  Airway & Oxygen Therapy: Patient Spontanous Breathing and Patient connected to nasal cannula oxygen  Post-op Assessment: Report given to RN and Post -op Vital signs reviewed and stable  Post vital signs: Reviewed and stable  Last Vitals:  Vitals Value Taken Time  BP 98/61 05/13/22 1109  Temp    Pulse 67 05/13/22 1111  Resp 16 05/13/22 1111  SpO2 96 % 05/13/22 1111  Vitals shown include unvalidated device data.  Last Pain:  Vitals:   05/13/22 0945  TempSrc: Temporal  PainSc: 0-No pain      Patients Stated Pain Goal: 0 (41/42/39 5320)  Complications: No notable events documented.

## 2022-05-13 NOTE — Anesthesia Procedure Notes (Signed)
Procedure Name: MAC Date/Time: 05/13/2022 10:46 AM Performed by: Leonor Liv, CRNA Pre-anesthesia Checklist: Patient identified, Emergency Drugs available, Suction available, Patient being monitored and Timeout performed Patient Re-evaluated:Patient Re-evaluated prior to induction Oxygen Delivery Method: Nasal cannula Airway Equipment and Method: Bite block Placement Confirmation: positive ETCO2 Dental Injury: Teeth and Oropharynx as per pre-operative assessment

## 2022-05-13 NOTE — Anesthesia Postprocedure Evaluation (Signed)
Anesthesia Post Note  Patient: Benjamin Riley  Procedure(s) Performed: ESOPHAGOGASTRODUODENOSCOPY (EGD) WITH PROPOFOL BIOPSY     Patient location during evaluation: Endoscopy Anesthesia Type: MAC Level of consciousness: awake and alert Pain management: pain level controlled Vital Signs Assessment: post-procedure vital signs reviewed and stable Respiratory status: spontaneous breathing and nonlabored ventilation Cardiovascular status: blood pressure returned to baseline and stable Postop Assessment: no apparent nausea or vomiting Anesthetic complications: no   No notable events documented.  Last Vitals:  Vitals:   05/13/22 1125 05/13/22 1151  BP: 109/65 109/65  Pulse: (!) 56 61  Resp: 14 15  Temp: 36.9 C 36.8 C  SpO2: 93% 97%    Last Pain:  Vitals:   05/13/22 1151  TempSrc: Oral  PainSc:                  Merlinda Frederick

## 2022-05-13 NOTE — Progress Notes (Signed)
PROGRESS NOTE    Benjamin Riley  [id number] DOB: 1961-07-12 DOA: 05/11/2022 PCP: Marcene Corning, MD  Outpatient Specialists:     Brief Narrative:  Admitted earlier today with epigastric pain.  Cardiology team is cleared patient.  GI team consulted.  05/12/2022: Patient seen alongside patient's wife.  Epigastric pain has improved.  Awaiting GI input. 05/13/2022: Patient underwent EGD today.  EGD revealed mild gastritis and Mallory-Weiss tears.  Patient will be on PPI for 1 month.  Follow biopsy results.  Abdominal ultrasound revealed cholelithiasis.  Surgical team has been consulted.  Surgery team plans to proceed with cholecystectomy tomorrow morning.   Assessment & Plan:   Principal Problem:   Epigastric pain Active Problems:   Diabetes mellitus type 2, noninsulin dependent (HCC)   Mixed hyperlipidemia   Status post coronary artery stent placement   Obesity (BMI 30-39.9)   Essential hypertension   Elevated troponin   Calculus of gallbladder without cholecystitis without obstruction   Nausea and vomiting   Mallory-Weiss tear   Gastritis without bleeding   Epigastric pain/cholelithiasis with possible acute cholecystitis: Presented with epigastric pain and elevated troponin.  However symptoms correlate more with GI source and possible duodenal ulcer.  There is also question of whether he had coffee-ground emesis. -will start IV PPI -carafate TID   Will continue to trend troponin overnight and if there is significant delta change will start IV heparin and cardiology will follow with stress test in the morning.If troponins flatten, will need GI consult for endoscopy.  -keep NPO past midnight 05/12/2022: Epigastric pain has resolved.  GI team has been consulted.  Awaiting GI input. 05/13/2022: EGD revealed mild gastritis and Mallory-Weiss tears.  Abdominal ultrasound revealed cholelithiasis.  Surgery team has been consulted.  Patient has been started on IV Rocephin.  For  cholecystectomy tomorrow morning.   Essential hypertension Continue losartan   Status post coronary artery stent placement Remote hx of stent to LAD -Had issues with angina in 2018 with left heart cath showing single-vessel CAD with patent stent in the proximal LAD and moderate stenosis in the proximal/mid LAD involving the first and second diagonal branch.  Moderate RCA stenosis.  Aggressive medical therapy was recommended at that time. -Continue to trend elevated troponin as above 05/12/2022: Patient has been cleared by the cardiology team.  Patient will follow with the cardiology team on discharge.  Troponin has not been impressive.   Mixed hyperlipidemia Continue Zetia -Also on Repatha outpatient   Diabetes mellitus type 2, noninsulin dependent ([place]) HbA1C 7.6% (12/2021). Goal <7% -place on sensitive SSI   DVT prophylaxis: SCD Code Status: Full code Family Communication: Wife Disposition Plan: Likely home tomorrow   Consultants:  Cardiology and GI  Procedures:  None for now  Antimicrobials:  IV Rocephin started today (05/13/2022).     Subjective: Epigastric pain has resolved. No chest pain  Objective: Vitals:   05/13/22 0945 05/13/22 1110 05/13/22 1125 05/13/22 1151  BP: 126/63 98/61 109/65 109/65  Pulse: 67 65 (!) 56 61  Resp: 18 16 14 15   Temp: 97.9 F (36.6 C) 98.4 F (36.9 C) 98.4 F (36.9 C) 98.2 F (36.8 C)  TempSrc: Temporal   Oral  SpO2: 97% 95% 93% 97%  Weight:      Height:        Intake/Output Summary (Last 24 hours) at 05/13/2022 1355 Last data filed at 05/13/2022 1109 Gross per 24 hour  Intake 300 ml  Output --  Net 300 ml    Danley Danker  Weights   05/11/22 1251  Weight: 106.6 kg    Examination:  General exam: Appears calm and comfortable  Respiratory system: Clear to auscultation.  Cardiovascular system: S1 & S2.  Patient has systolic murmur.  Gastrointestinal system: Abdomen is soft and nontender.  Central nervous system: Alert and  oriented. No focal neurological deficits. Extremities: No leg edema.   Data Reviewed: I have personally reviewed following labs and imaging studies  CBC: Recent Labs  Lab 05/11/22 1257 05/12/22 0218 05/13/22 0059  WBC 15.6* 16.9* 16.2*  NEUTROABS  --   --  13.3*  HGB 13.4 12.3* 11.5*  HCT 40.3 37.2* 35.2*  MCV 95.3 97.6 98.1  PLT 372 306 [id number]    Basic Metabolic Panel: Recent Labs  Lab 05/11/22 1257 05/13/22 0059  NA 140 133*  K 4.3 3.7  CL 99 103  CO2 26 24  GLUCOSE 175* 116*  BUN 15 13  CREATININE 0.94 0.95  CALCIUM 10.2 8.6*  MG  --  2.2  PHOS  --  1.7*    GFR: Estimated Creatinine Clearance: 101.1 mL/min (by C-G formula based on SCr of 0.95 mg/dL). Liver Function Tests: Recent Labs  Lab 05/11/22 1257 05/13/22 0059  AST 18 16  ALT 20 15  ALKPHOS 90 72  BILITOT 0.4 0.9  PROT 7.8 5.8*  ALBUMIN 4.6 3.2*  3.2*    Recent Labs  Lab 05/11/22 1257  LIPASE 24    No results for input(s): AMMONIA in the last 168 hours. Coagulation Profile: No results for input(s): INR, PROTIME in the last 168 hours. Cardiac Enzymes: No results for input(s): CKTOTAL, CKMB, CKMBINDEX, TROPONINI in the last 168 hours. BNP (last 3 results) No results for input(s): PROBNP in the last 8760 hours. HbA1C: Recent Labs    05/11/22 2317  HGBA1C 7.5*    CBG: Recent Labs  Lab 05/12/22 1706 05/12/22 2136 05/13/22 0812 05/13/22 1113 05/13/22 1153  GLUCAP 128* 162* 116* 123* 113*    Lipid Profile: No results for input(s): CHOL, HDL, LDLCALC, TRIG, CHOLHDL, LDLDIRECT in the last 72 hours. Thyroid Function Tests: No results for input(s): TSH, T4TOTAL, FREET4, T3FREE, THYROIDAB in the last 72 hours. Anemia Panel: No results for input(s): VITAMINB12, FOLATE, FERRITIN, TIBC, IRON, RETICCTPCT in the last 72 hours. Urine analysis:    Component Value Date/Time   COLORURINE YELLOW 05/11/2022 1257   APPEARANCEUR CLEAR 05/11/2022 1257   LABSPEC >1.046 (H) 05/11/2022 1257    PHURINE 5.5 05/11/2022 1257   GLUCOSEU >1,000 (A) 05/11/2022 1257   HGBUR NEGATIVE 05/11/2022 1257   BILIRUBINUR NEGATIVE 05/11/2022 1257   KETONESUR 40 (A) 05/11/2022 1257   PROTEINUR TRACE (A) 05/11/2022 1257   NITRITE NEGATIVE 05/11/2022 1257   LEUKOCYTESUR NEGATIVE 05/11/2022 1257   Sepsis Labs: @LABRCNTIP (procalcitonin:4,lacticidven:4)  )No results found for this or any previous visit (from the past 240 hour(s)).       Radiology Studies: CT Abdomen Pelvis W Contrast  Result Date: 05/11/2022 CLINICAL DATA:  Acute mid abdominal pain since early this morning, nausea, vomiting. Past history of MI, diabetes mellitus, appendectomy EXAM: CT ABDOMEN AND PELVIS WITH CONTRAST TECHNIQUE: Multidetector CT imaging of the abdomen and pelvis was performed using the standard protocol following bolus administration of intravenous contrast. RADIATION DOSE REDUCTION: This exam was performed according to the departmental dose-optimization program which includes automated exposure control, adjustment of the mA and/or kV according to patient size and/or use of iterative reconstruction technique. CONTRAST:  23mL OMNIPAQUE IOHEXOL 300 MG/ML SOLN IV. No oral contrast.  COMPARISON:  None FINDINGS: Lower chest: Minimal dependent atelectasis posterior RIGHT lower lobe. Hepatobiliary: 8 mm calcified gallstone in gallbladder. Gallbladder and liver otherwise normal appearance. Pancreas: Normal appearance Spleen: Normal appearance Adrenals/Urinary Tract: Adrenal glands normal appearance. RIGHT renal cyst 3.2 cm diameter; no follow-up imaging recommended. Kidneys, ureters, and bladder otherwise normal appearance. Stomach/Bowel: Appendix surgically absent by history. Diverticulosis of descending and sigmoid colon without evidence of diverticulitis. Rectum under distended, unable to exclude wall thickening in this setting. Duodenal diverticulum at cranial aspect of third portion. Remaining bowel loops unremarkable. Stomach  normal appearance. Vascular/Lymphatic: Atherosclerotic calcifications aorta and iliac arteries without aneurysm. No adenopathy. Reproductive: Mild prostatic enlargement. Seminal vesicles unremarkable Other: Probable BILATERAL inguinal hernias containing fat. No free air or free fluid. Musculoskeletal: Mild degenerative disc disease changes lower lumbar spine. IMPRESSION: Distal colonic diverticulosis without evidence of diverticulitis. Cholelithiasis. Mild prostatic enlargement. Probable BILATERAL inguinal hernias containing fat. No acute intra-abdominal or intrapelvic process identified. Aortic Atherosclerosis (ICD10-I70.0). Electronically Signed   By: Lavonia Dana M.D.   On: 05/11/2022 14:18   DG Chest Port 1 View  Result Date: 05/11/2022 CLINICAL DATA:  Upper abdominal pain with vomiting since 1 a.m. EXAM: PORTABLE CHEST 1 VIEW COMPARISON:  08/22/2017 FINDINGS: Numerous leads and wires project over the chest. Midline trachea. Borderline cardiomegaly. Mediastinal contours otherwise within normal limits. No pleural effusion or pneumothorax. Clear lungs. IMPRESSION: No acute cardiopulmonary disease. Electronically Signed   By: Abigail Miyamoto M.D.   On: 05/11/2022 14:55   US Abdomen Limited RUQ (LIVER/GB)  Result Date: 05/12/2022 CLINICAL DATA:  Epigastric pain EXAM: ULTRASOUND ABDOMEN LIMITED RIGHT UPPER QUADRANT COMPARISON:  CT 05/11/2022 FINDINGS: Gallbladder: 1.0 cm gallstone and sludge within the gallbladder. Wall thickening measuring up to 6 mm. Negative sonographic Murphy's. Common bile duct: Diameter: Normal caliber, 4 mm Liver: No focal lesion identified. Within normal limits in parenchymal echogenicity. Portal vein is patent on color Doppler imaging with normal direction of blood flow towards the liver. Other: None. IMPRESSION: Cholelithiasis. Sludge within the gallbladder. Mild gallbladder wall thickening without sonographic Murphy sign. Recommend clinical correlation to exclude acute cholecystitis.  Electronically Signed   By: Rolm Baptise M.D.   On: 05/12/2022 19:08        Scheduled Meds:  vitamin C  1,000 mg Oral Daily   aspirin EC  81 mg Oral Daily   cholecalciferol  2,000 Units Oral Daily   ezetimibe  10 mg Oral Daily   insulin aspart  0-9 Units Subcutaneous TID WC   loratadine  10 mg Oral Daily   losartan  50 mg Oral Daily   pantoprazole  40 mg Oral Q0600   vitamin B-12  2,000 mcg Oral Daily   Continuous Infusions:   LOS: 0 days    Time spent: 35 minutes    Dana Allan, MD  [facility] Pager #: [phone] 7PM-7AM contact night coverage as above

## 2022-05-13 NOTE — Anesthesia Preprocedure Evaluation (Addendum)
Anesthesia Evaluation  Patient identified by MRN, date of birth, ID band Patient awake    Reviewed: Allergy & Precautions, NPO status , Patient's Chart, lab work & pertinent test results  Airway Mallampati: II  TM Distance: >3 FB Neck ROM: Full    Dental no notable dental hx.    Pulmonary neg pulmonary ROS,    Pulmonary exam normal breath sounds clear to auscultation       Cardiovascular hypertension, Pt. on medications + angina with exertion + CAD, + Past MI and + Cardiac Stents  negative cardio ROS Normal cardiovascular exam Rhythm:Regular Rate:Normal     Neuro/Psych negative neurological ROS  negative psych ROS   GI/Hepatic Neg liver ROS, Abdominal pain, nausea/vomiting   Endo/Other  diabetes (on ozempic), Well Controlled, Type 2, Oral Hypoglycemic AgentsObesity Hyperlipidemia  Renal/GU negative Renal ROS  negative genitourinary   Musculoskeletal negative musculoskeletal ROS (+)   Abdominal   Peds negative pediatric ROS (+)  Hematology negative hematology ROS (+)   Anesthesia Other Findings   Reproductive/Obstetrics negative OB ROS                            Anesthesia Physical Anesthesia Plan  ASA: 3  Anesthesia Plan: MAC   Post-op Pain Management: Minimal or no pain anticipated   Induction:   PONV Risk Score and Plan: 1 and Treatment may vary due to age or medical condition and Propofol infusion  Airway Management Planned: Natural Airway and Simple Face Mask  Additional Equipment: None  Intra-op Plan:   Post-operative Plan:   Informed Consent: I have reviewed the patients History and Physical, chart, labs and discussed the procedure including the risks, benefits and alternatives for the proposed anesthesia with the patient or authorized representative who has indicated his/her understanding and acceptance.       Plan Discussed with: CRNA, Anesthesiologist and  Surgeon  Anesthesia Plan Comments: (No nausea/vomiting. OK for MAC. Norton Blizzard, MD  )      Anesthesia Quick Evaluation

## 2022-05-13 NOTE — Interval H&P Note (Signed)
History and Physical Interval Note: For EGD today to exclude pathology in the upper GI tract with recent upper abdominal pain, nausea vomiting. Gallstones and gallbladder wall thickening with equivocal findings for acute cholecystitis by ultrasound.  This is likely the problem leading to his symptoms. We will perform upper endoscopy and if unrevealing consult surgery for consideration of cholecystectomy The nature of the procedure, as well as the risks, benefits, and alternatives were carefully and thoroughly reviewed with the patient. Ample time for discussion and questions allowed. The patient understood, was satisfied, and agreed to proceed.   05/13/2022 10:39 AM  Benjamin Riley  has presented today for surgery, with the diagnosis of epigastric pain, nausea and vomiting.  The various methods of treatment have been discussed with the patient and family. After consideration of risks, benefits and other options for treatment, the patient has consented to  Procedure(s): ESOPHAGOGASTRODUODENOSCOPY (EGD) WITH PROPOFOL (N/A) as a surgical intervention.  The patient's history has been reviewed, patient examined, no change in status, stable for surgery.  I have reviewed the patient's chart and labs.  Questions were answered to the patient's satisfaction.     [name] Arnold Kester

## 2022-05-13 NOTE — Consult Note (Signed)
Benjamin Riley 09-12-1961  [id number].    Requesting MD: Dr. Erick Blinks Chief Complaint/Reason for Consult: cholelithiasis, epigastric abdominal pain  HPI:  This is a 61 yo male with a history of CAD, s/p stenting 12 yrs ago, DM, HTN, and HLD who awoke from sleep 3 nights ago with severe epigastric abdominal pain with nausea and vomiting.  He had extensive vomiting.  Nothing seemed to help and as this was continuing he decided to present to the ED for evaluation.  He has had one prior episode similar to this several weeks ago which awoke him during sleep as well.  This resolved within a day.  These were not definitively associated with eating.  In the ED he underwent a CT scan that had no specific findings except a calcified gallstone.  No evidence of cholecystitis.  His LFTs are normal, but his WBC elevated at 15K.  This has remained elevated.  He was admitted and cards saw him who did not feel this was cardiac in nature.  GI then saw him and ordered an abdominal US which revealed wall thickening up to 6mm, gallstones, but no Murphy's sign.  He underwent EGD today which was relatively unrevealed for his symptoms.  He was noted incidentally to have a Mallory-Weiss tear and some mild gastritis.  We have been asked to see him for evaluation of his gallbladder.   ROS: ROS: Please see HPI  Family History  Problem Relation Age of Onset   Stroke Mother    Macular degeneration Mother    Thyroid cancer Mother    Heart disease Father    Heart disease Brother     Past Medical History:  Diagnosis Date   Coronary artery disease    Diabetes mellitus without complication (HCC)    Myocardial infarct (HCC)     Past Surgical History:  Procedure Laterality Date   APPENDECTOMY     BACK SURGERY     CARDIAC CATHETERIZATION     CORONARY ANGIOPLASTY     CORONARY STENT PLACEMENT     LEFT HEART CATH AND CORONARY ANGIOGRAPHY N/A 08/24/2017   Procedure: LEFT HEART CATH AND CORONARY ANGIOGRAPHY;   Surgeon: Tonny Bollman, MD;  Location: [facility] INVASIVE CV LAB;  Service: Cardiovascular;  Laterality: N/A;   SINUS SURGERY WITH INSTATRAK      Social History:  reports that he has never smoked. He has never used smokeless tobacco. He reports that he does not drink alcohol and does not use drugs.  Allergies:  Allergies  Allergen Reactions   Lisinopril Other (See Comments)    unknown   Statins Other (See Comments)    Leg cramps with simvastatin and atorvastatin     Medications Prior to Admission  Medication Sig Dispense Refill   Ascorbic Acid (VITAMIN C) 1000 MG tablet Take 1,000 mg by mouth daily.     aspirin EC 81 MG tablet Take 81 mg by mouth daily.     Cholecalciferol (VITAMIN D3) 2000 units capsule Take 2,000 Units by mouth daily.     Coenzyme Q10 (COQ10) 100 MG CAPS Take 100 mg by mouth daily.     Cyanocobalamin 2000 MCG TBCR Take 2,000 mcg by mouth daily.     Evolocumab (REPATHA SURECLICK) 140 MG/ML SOAJ INJECT 140 MG  UNDER THE SKIN EVERY 2 WEEKS AS DIRECTED (Patient taking differently: Inject 140 mg into the muscle every 14 (fourteen) days.) 6 mL 4   ezetimibe (ZETIA) 10 MG tablet Take 10 mg by mouth daily.  glipiZIDE (GLUCOTROL) 10 MG tablet Take 10 mg by mouth daily before breakfast.  3   JARDIANCE 10 MG TABS tablet Take 10 mg by mouth daily.  3   loratadine (CLARITIN) 10 MG tablet Take 10 mg by mouth daily.     losartan (COZAAR) 50 MG tablet Take 1 tablet (50 mg total) by mouth daily. 90 tablet 3   metFORMIN (GLUCOPHAGE-XR) 500 MG 24 hr tablet Take 500 mg by mouth 2 (two) times daily.  3   nitroGLYCERIN (NITROSTAT) 0.4 MG SL tablet Place 1 tablet (0.4 mg total) under the tongue every 5 (five) minutes as needed for chest pain. 25 tablet 12   pioglitazone (ACTOS) 30 MG tablet Take 30 mg by mouth daily.  2   Semaglutide, 2 MG/DOSE, (OZEMPIC, 2 MG/DOSE,) 8 MG/3ML SOPN Inject 2 mg into the skin once a week. Sundays       Physical Exam: Blood pressure 109/65, pulse 61,  temperature 98.2 F (36.8 C), temperature source Oral, resp. rate 15, height 5\' 10"  (1.778 m), weight 106.6 kg, SpO2 97 %. General: pleasant, WD, WN white male who is laying in bed in NAD HEENT: head is normocephalic, atraumatic.  Sclera are noninjected.  PERRL.  Ears and nose without any masses or lesions.  Mouth is pink and moist Heart: regular, rate, and rhythm.  Normal s1,s2. No obvious murmurs, gallops, or rubs noted.  Palpable radial and pedal pulses bilaterally Lungs: CTAB, no wheezes, rhonchi, or rales noted.  Respiratory effort nonlabored Abd: soft, tender in RUQ with some voluntary guarding.  + Murphy's sign, ND, +BS, no masses, hernias, or organomegaly, scar noted in RLQ from prior appendectomy MS: all 4 extremities are symmetrical with no cyanosis, clubbing, or edema. Skin: warm and dry with no masses, lesions, or rashes Neuro: Cranial nerves 2-12 grossly intact, sensation is normal throughout Psych: A&Ox3 with an appropriate affect.   Results for orders placed or performed during the hospital encounter of 05/11/22 (from the past 48 hour(s))  Troponin I (High Sensitivity)     Status: Abnormal   Collection Time: 05/11/22  3:05 PM  Result Value Ref Range   Troponin I (High Sensitivity) 30 (H) <18 ng/L    Comment: (NOTE) Elevated high sensitivity troponin I (hsTnI) values and significant  changes across serial measurements may suggest ACS but many other  chronic and acute conditions are known to elevate hsTnI results.  Refer to the "Links" section for chest pain algorithms and additional  guidance. Performed at 05/13/22, [address] [place], [place]   Troponin I (High Sensitivity)     Status: Abnormal   Collection Time: 05/11/22  8:05 PM  Result Value Ref Range   Troponin I (High Sensitivity) 28 (H) <18 ng/L    Comment: (NOTE) Elevated high sensitivity troponin I (hsTnI) values and significant  changes across serial measurements may  suggest ACS but many other  chronic and acute conditions are known to elevate hsTnI results.  Refer to the "Links" section for chest pain algorithms and additional  guidance. Performed at [facility], [address] [place], [place]   Glucose, capillary     Status: Abnormal   Collection Time: 05/11/22  9:09 PM  Result Value Ref Range   Glucose-Capillary 173 (H) 70 - 99 mg/dL    Comment: Glucose reference range applies only to samples taken after fasting for at least 8 hours.  Troponin I (High Sensitivity)     Status: Abnormal  Collection Time: 05/11/22 11:17 PM  Result Value Ref Range   Troponin I (High Sensitivity) 27 (H) <18 ng/L    Comment: (NOTE) Elevated high sensitivity troponin I (hsTnI) values and significant  changes across serial measurements may suggest ACS but many other  chronic and acute conditions are known to elevate hsTnI results.  Refer to the "Links" section for chest pain algorithms and additional  guidance. Performed at [facility], [address], [place], [place] [address]   Heparin level (unfractionated)     Status: Abnormal   Collection Time: 05/11/22 11:17 PM  Result Value Ref Range   Heparin Unfractionated <0.10 (L) 0.30 - 0.70 IU/mL    Comment: (NOTE) The clinical reportable range upper limit is being lowered to >1.10 to align with the FDA approved guidance for the current laboratory assay.  If heparin results are below expected values, and patient dosage has  been confirmed, suggest follow up testing of antithrombin III levels. Performed at [facility], [address] [place], [place] [address]   Hemoglobin A1c     Status: Abnormal   Collection Time: 05/11/22 11:17 PM  Result Value Ref Range   Hgb A1c MFr Bld 7.5 (H) 4.8 - 5.6 %    Comment: (NOTE) Pre diabetes:          5.7%-6.4%  Diabetes:              >6.4%  Glycemic control for   <7.0% adults with diabetes    Mean Plasma Glucose 168.55 mg/dL     Comment: Performed at [facility], [address] [place], [place] [address]  CBC     Status: Abnormal   Collection Time: 05/12/22  2:18 AM  Result Value Ref Range   WBC 16.9 (H) 4.0 - 10.5 K/uL   RBC 3.81 (L) 4.22 - 5.81 MIL/uL   Hemoglobin 12.3 (L) 13.0 - 17.0 g/dL   HCT 13.0 (L) 86.5 - 78.4 %   MCV 97.6 80.0 - 100.0 fL   MCH 32.3 26.0 - 34.0 pg   MCHC 33.1 30.0 - 36.0 g/dL   RDW 69.6 29.5 - 28.4 %   Platelets 306 150 - 400 K/uL   nRBC 0.0 0.0 - 0.2 %    Comment: Performed at [facility], [address], [place], [place] [address]  Glucose, capillary     Status: Abnormal   Collection Time: 05/12/22  8:50 AM  Result Value Ref Range   Glucose-Capillary 179 (H) 70 - 99 mg/dL    Comment: Glucose reference range applies only to samples taken after fasting for at least 8 hours.  Glucose, capillary     Status: Abnormal   Collection Time: 05/12/22 11:50 AM  Result Value Ref Range   Glucose-Capillary 113 (H) 70 - 99 mg/dL    Comment: Glucose reference range applies only to samples taken after fasting for at least 8 hours.  Glucose, capillary     Status: Abnormal   Collection Time: 05/12/22  5:06 PM  Result Value Ref Range   Glucose-Capillary 128 (H) 70 - 99 mg/dL    Comment: Glucose reference range applies only to samples taken after fasting for at least 8 hours.  Glucose, capillary     Status: Abnormal   Collection Time: 05/12/22  9:36 PM  Result Value Ref Range   Glucose-Capillary 162 (H) 70 - 99 mg/dL    Comment: Glucose reference range applies only to samples taken after fasting for  at least 8 hours.  CBC with Differential/Platelet     Status: Abnormal   Collection Time: 05/13/22 12:59 AM  Result Value Ref Range   WBC 16.2 (H) 4.0 - 10.5 K/uL   RBC 3.59 (L) 4.22 - 5.81 MIL/uL   Hemoglobin 11.5 (L) 13.0 - 17.0 g/dL   HCT 57.835.2 (L) 46.939.0 - 62.952.0 %   MCV 98.1 80.0 - 100.0 fL   MCH 32.0 26.0 - 34.0 pg   MCHC 32.7 30.0 - 36.0 g/dL   RDW 52.813.7 41.311.5 - 24.415.5 %   Platelets  260 150 - 400 K/uL   nRBC 0.0 0.0 - 0.2 %   Neutrophils Relative % 83 %   Neutro Abs 13.3 (H) 1.7 - 7.7 K/uL   Lymphocytes Relative 8 %   Lymphs Abs 1.3 0.7 - 4.0 K/uL   Monocytes Relative 8 %   Monocytes Absolute 1.4 (H) 0.1 - 1.0 K/uL   Eosinophils Relative 0 %   Eosinophils Absolute 0.0 0.0 - 0.5 K/uL   Basophils Relative 0 %   Basophils Absolute 0.1 0.0 - 0.1 K/uL   Immature Granulocytes 1 %   Abs Immature Granulocytes 0.12 (H) 0.00 - 0.07 K/uL    Comment: Performed at [facility], [address] [place], [place] [address]  Renal function panel     Status: Abnormal   Collection Time: 05/13/22 12:59 AM  Result Value Ref Range   Sodium 133 (L) 135 - 145 mmol/L   Potassium 3.7 3.5 - 5.1 mmol/L   Chloride 103 98 - 111 mmol/L   CO2 24 22 - 32 mmol/L   Glucose, Bld 116 (H) 70 - 99 mg/dL    Comment: Glucose reference range applies only to samples taken after fasting for at least 8 hours.   BUN 13 6 - 20 mg/dL   Creatinine, Ser 2.530.95 0.61 - 1.24 mg/dL   Calcium 8.6 (L) 8.9 - 10.3 mg/dL   Phosphorus 1.7 (L) 2.5 - 4.6 mg/dL   Albumin 3.2 (L) 3.5 - 5.0 g/dL   GFR, Estimated >66>60 >44>60 mL/min    Comment: (NOTE) Calculated using the CKD-EPI Creatinine Equation (2021)    Anion gap 6 5 - 15    Comment: Performed at [facility], [address] [place], [place] [address]  Magnesium     Status: None   Collection Time: 05/13/22 12:59 AM  Result Value Ref Range   Magnesium 2.2 1.7 - 2.4 mg/dL    Comment: Performed at [facility], [address] [place], [place] [address]  Hepatic function panel Once     Status: Abnormal   Collection Time: 05/13/22 12:59 AM  Result Value Ref Range   Total Protein 5.8 (L) 6.5 - 8.1 g/dL   Albumin 3.2 (L) 3.5 - 5.0 g/dL   AST 16 15 - 41 U/L   ALT 15 0 - 44 U/L   Alkaline Phosphatase 72 38 - 126 U/L   Total Bilirubin 0.9 0.3 - 1.2 mg/dL   Bilirubin, Direct 0.2 0.0 - 0.2 mg/dL   Indirect Bilirubin 0.7 0.3 - 0.9 mg/dL    Comment:  Performed at [facility], [address] [place], [place] [address]  Glucose, capillary     Status: Abnormal   Collection Time: 05/13/22  8:12 AM  Result Value Ref Range   Glucose-Capillary 116 (H) 70 - 99 mg/dL    Comment: Glucose reference range applies only to samples taken after fasting for at  least 8 hours.  Glucose, capillary     Status: Abnormal   Collection Time: 05/13/22 11:13 AM  Result Value Ref Range   Glucose-Capillary 123 (H) 70 - 99 mg/dL    Comment: Glucose reference range applies only to samples taken after fasting for at least 8 hours.  Glucose, capillary     Status: Abnormal   Collection Time: 05/13/22 11:53 AM  Result Value Ref Range   Glucose-Capillary 113 (H) 70 - 99 mg/dL    Comment: Glucose reference range applies only to samples taken after fasting for at least 8 hours.   DG Chest Port 1 View  Result Date: 05/11/2022 CLINICAL DATA:  Upper abdominal pain with vomiting since 1 a.m. EXAM: PORTABLE CHEST 1 VIEW COMPARISON:  08/22/2017 FINDINGS: Numerous leads and wires project over the chest. Midline trachea. Borderline cardiomegaly. Mediastinal contours otherwise within normal limits. No pleural effusion or pneumothorax. Clear lungs. IMPRESSION: No acute cardiopulmonary disease. Electronically Signed   By: Jeronimo Greaves M.D.   On: 05/11/2022 14:55   US Abdomen Limited RUQ (LIVER/GB)  Result Date: 05/12/2022 CLINICAL DATA:  Epigastric pain EXAM: ULTRASOUND ABDOMEN LIMITED RIGHT UPPER QUADRANT COMPARISON:  CT 05/11/2022 FINDINGS: Gallbladder: 1.0 cm gallstone and sludge within the gallbladder. Wall thickening measuring up to 6 mm. Negative sonographic Murphy's. Common bile duct: Diameter: Normal caliber, 4 mm Liver: No focal lesion identified. Within normal limits in parenchymal echogenicity. Portal vein is patent on color Doppler imaging with normal direction of blood flow towards the liver. Other: None. IMPRESSION: Cholelithiasis. Sludge within the gallbladder. Mild  gallbladder wall thickening without sonographic Murphy sign. Recommend clinical correlation to exclude acute cholecystitis. Electronically Signed   By: Charlett Nose M.D.   On: 05/12/2022 19:08      Assessment/Plan Cholelithiasis with cholecystitis The patient has been seen, examined, chart, labs, vitals, imaging, op notes reviewed.  He appears to have symptoms c/w biliary disease and cholecystitis.  His gb wall is 70mm thick and his WBC are still elevated around 16K.  He is definitely tender in his RUQ over his gallbladder.  We will start him on Rocephin  We will allow him to have clear liquids today and then plan for lap chole tomorrow.  I have explained the procedure, risks, and aftercare of cholecystectomy.  Risks include but are not limited to bleeding, infection, wound problems, anesthesia, diarrhea, bile leak, injury to common bile duct/liver/intestine.  He seems to understand and agrees to proceed.  Wife who is at bedside understands and agrees with this plan as well.  FEN - CLD, NPO p MN/IVFs VTE - ok for chemical prophylaxis from our standpoint ID - Rocephin 6/2 -->  CAD HTN HLD DM  I reviewed Consultant GI notes, hospitalist notes, last 24 h vitals and pain scores, last 48 h intake and output, last 24 h labs and trends, and last 24 h imaging results.  Letha Cape, [facility] 05/13/2022, 2:24 PM Please see Amion for pager number during day hours 7:00am-4:30pm or 7:00am -11:30am on weekends

## 2022-05-14 ENCOUNTER — Inpatient Hospital Stay (HOSPITAL_COMMUNITY): Payer: No Typology Code available for payment source | Admitting: Critical Care Medicine

## 2022-05-14 ENCOUNTER — Encounter (HOSPITAL_COMMUNITY)
Admission: EM | Disposition: A | Discharge status: Home / Self Care | Payer: Self-pay | Source: Home / Self Care | Attending: Internal Medicine

## 2022-05-14 ENCOUNTER — Encounter (HOSPITAL_COMMUNITY): Payer: Self-pay | Admitting: Internal Medicine

## 2022-05-14 DIAGNOSIS — K810 Acute cholecystitis: Secondary | ICD-10-CM

## 2022-05-14 DIAGNOSIS — R1013 Epigastric pain: Secondary | ICD-10-CM | POA: Diagnosis not present

## 2022-05-14 DIAGNOSIS — I252 Old myocardial infarction: Secondary | ICD-10-CM | POA: Diagnosis not present

## 2022-05-14 DIAGNOSIS — I10 Essential (primary) hypertension: Secondary | ICD-10-CM

## 2022-05-14 DIAGNOSIS — I2510 Atherosclerotic heart disease of native coronary artery without angina pectoris: Secondary | ICD-10-CM

## 2022-05-14 HISTORY — PX: CHOLECYSTECTOMY: SHX55

## 2022-05-14 LAB — SURGICAL PCR SCREEN
MRSA, PCR: NEGATIVE
Staphylococcus aureus: NEGATIVE

## 2022-05-14 LAB — GLUCOSE, CAPILLARY
Glucose-Capillary: 128 mg/dL — ABNORMAL HIGH (ref 70–99)
Glucose-Capillary: 172 mg/dL — ABNORMAL HIGH (ref 70–99)
Glucose-Capillary: 160 mg/dL — ABNORMAL HIGH (ref 70–99)
Glucose-Capillary: 208 mg/dL — ABNORMAL HIGH (ref 70–99)
Glucose-Capillary: 207 mg/dL — ABNORMAL HIGH (ref 70–99)

## 2022-05-14 SURGERY — LAPAROSCOPIC CHOLECYSTECTOMY
Anesthesia: General

## 2022-05-14 MED ORDER — SUGAMMADEX SODIUM 200 MG/2ML IV SOLN
INTRAVENOUS | Status: DC | PRN
  Administered 2022-05-14: 200 mg via INTRAVENOUS

## 2022-05-14 MED ORDER — FENTANYL CITRATE (PF) 250 MCG/5ML IJ SOLN
INTRAMUSCULAR | Status: DC | PRN
  Administered 2022-05-14: 100 ug via INTRAVENOUS
  Administered 2022-05-14 (×3): 50 ug via INTRAVENOUS

## 2022-05-14 MED ORDER — AMISULPRIDE (ANTIEMETIC) 5 MG/2ML IV SOLN
10.0000 mg | Freq: Once | INTRAVENOUS | Status: AC
Start: 2022-05-14 — End: 2022-05-14
  Administered 2022-05-14: 10 mg via INTRAVENOUS

## 2022-05-14 MED ORDER — CHLORHEXIDINE GLUCONATE 0.12 % MT SOLN: 15.0000 mL | Freq: Once | OROMUCOSAL | Status: AC

## 2022-05-14 MED ORDER — PROPOFOL 10 MG/ML IV BOLUS
INTRAVENOUS | Status: AC
  Filled 2022-05-14: qty 20

## 2022-05-14 MED ORDER — ACETAMINOPHEN 10 MG/ML IV SOLN
INTRAVENOUS | Status: DC | PRN
  Administered 2022-05-14: 1000 mg via INTRAVENOUS

## 2022-05-14 MED ORDER — LACTATED RINGERS IV SOLN: INTRAVENOUS | Status: DC

## 2022-05-14 MED ORDER — PROPOFOL 10 MG/ML IV BOLUS
INTRAVENOUS | Status: DC | PRN
  Administered 2022-05-14: 120 mg via INTRAVENOUS
  Administered 2022-05-14: 20 mg via INTRAVENOUS
  Administered 2022-05-14: 30 mg via INTRAVENOUS
  Administered 2022-05-14: 20 mg via INTRAVENOUS
  Administered 2022-05-14: 30 mg via INTRAVENOUS
  Administered 2022-05-14: 20 mg via INTRAVENOUS

## 2022-05-14 MED ORDER — SODIUM CHLORIDE 0.9 % IR SOLN
Status: DC | PRN
  Administered 2022-05-14: 1000 mL

## 2022-05-14 MED ORDER — FENTANYL CITRATE (PF) 250 MCG/5ML IJ SOLN
INTRAMUSCULAR | Status: AC
  Filled 2022-05-14: qty 5

## 2022-05-14 MED ORDER — INSULIN ASPART 100 UNIT/ML IJ SOLN: 0.0000 [IU] | INTRAMUSCULAR | Status: DC | PRN

## 2022-05-14 MED ORDER — ONDANSETRON HCL 4 MG/2ML IJ SOLN
INTRAMUSCULAR | Status: DC | PRN
  Administered 2022-05-14: 4 mg via INTRAVENOUS

## 2022-05-14 MED ORDER — AMISULPRIDE (ANTIEMETIC) 5 MG/2ML IV SOLN
INTRAVENOUS | Status: AC
  Filled 2022-05-14: qty 4

## 2022-05-14 MED ORDER — HYDROMORPHONE HCL 1 MG/ML IJ SOLN: 0.2500 mg | INTRAMUSCULAR | Status: DC | PRN

## 2022-05-14 MED ORDER — PHENYLEPHRINE HCL-NACL 20-0.9 MG/250ML-% IV SOLN
INTRAVENOUS | Status: DC | PRN
  Administered 2022-05-14: 40 ug/min via INTRAVENOUS

## 2022-05-14 MED ORDER — DEXAMETHASONE SODIUM PHOSPHATE 10 MG/ML IJ SOLN
INTRAMUSCULAR | Status: DC | PRN
  Administered 2022-05-14: 4 mg via INTRAVENOUS

## 2022-05-14 MED ORDER — DEXMEDETOMIDINE HCL IN NACL 80 MCG/20ML IV SOLN
INTRAVENOUS | Status: AC
  Filled 2022-05-14: qty 20

## 2022-05-14 MED ORDER — LIDOCAINE 2% (20 MG/ML) 5 ML SYRINGE
INTRAMUSCULAR | Status: DC | PRN
  Administered 2022-05-14: 60 mg via INTRAVENOUS

## 2022-05-14 MED ORDER — ACETAMINOPHEN 10 MG/ML IV SOLN
INTRAVENOUS | Status: AC
  Filled 2022-05-14: qty 100

## 2022-05-14 MED ORDER — OXYCODONE HCL 5 MG PO TABS
5.0000 mg | ORAL_TABLET | Freq: Four times a day (QID) | ORAL | Status: DC | PRN
  Administered 2022-05-14 – 2022-05-15 (×3): 5 mg via ORAL
  Filled 2022-05-14 (×3): qty 1

## 2022-05-14 MED ORDER — BUPIVACAINE-EPINEPHRINE 0.25% -1:200000 IJ SOLN
INTRAMUSCULAR | Status: DC | PRN
  Administered 2022-05-14: 30 mL

## 2022-05-14 MED ORDER — BUPIVACAINE-EPINEPHRINE (PF) 0.25% -1:200000 IJ SOLN
INTRAMUSCULAR | Status: AC
  Filled 2022-05-14: qty 30

## 2022-05-14 MED ORDER — MIDAZOLAM HCL 2 MG/2ML IJ SOLN
INTRAMUSCULAR | Status: AC
  Filled 2022-05-14: qty 2

## 2022-05-14 MED ORDER — MIDAZOLAM HCL 5 MG/5ML IJ SOLN
INTRAMUSCULAR | Status: DC | PRN
  Administered 2022-05-14: 2 mg via INTRAVENOUS

## 2022-05-14 MED ORDER — DEXMEDETOMIDINE HCL IN NACL 200 MCG/50ML IV SOLN
INTRAVENOUS | Status: DC | PRN
  Administered 2022-05-14: 8 ug via INTRAVENOUS
  Administered 2022-05-14: 12 ug via INTRAVENOUS

## 2022-05-14 MED ORDER — ROCURONIUM BROMIDE 10 MG/ML (PF) SYRINGE
PREFILLED_SYRINGE | INTRAVENOUS | Status: DC | PRN
  Administered 2022-05-14: 50 mg via INTRAVENOUS

## 2022-05-14 MED ORDER — CHLORHEXIDINE GLUCONATE 0.12 % MT SOLN
OROMUCOSAL | Status: AC
  Administered 2022-05-14: 15 mL via OROMUCOSAL
  Filled 2022-05-14: qty 15

## 2022-05-14 SURGICAL SUPPLY — 38 items
ADH SKN CLS APL DERMABOND .7 (GAUZE/BANDAGES/DRESSINGS) ×1
APL PRP STRL LF DISP 70% ISPRP (MISCELLANEOUS) ×1
APPLIER CLIP 5 13 M/L LIGAMAX5 (MISCELLANEOUS) ×2
APR CLP MED LRG 5 ANG JAW (MISCELLANEOUS) ×1
BAG SPEC RTRVL 10 TROC 200 (ENDOMECHANICALS) ×1
BLADE CLIPPER SURG (BLADE) ×1 IMPLANT
CANISTER SUCT 3000ML PPV (MISCELLANEOUS) ×3 IMPLANT
CHLORAPREP W/TINT 26 (MISCELLANEOUS) ×3 IMPLANT
CLIP APPLIE 5 13 M/L LIGAMAX5 (MISCELLANEOUS) IMPLANT
CLIP LIGATING HEMO O LOK GREEN (MISCELLANEOUS) ×4 IMPLANT
COVER SURGICAL LIGHT HANDLE (MISCELLANEOUS) ×3 IMPLANT
DERMABOND ADVANCED (GAUZE/BANDAGES/DRESSINGS) ×1
DERMABOND ADVANCED .7 DNX12 (GAUZE/BANDAGES/DRESSINGS) ×2 IMPLANT
ELECT REM PT RETURN 9FT ADLT (ELECTROSURGICAL) ×2
ELECTRODE REM PT RTRN 9FT ADLT (ELECTROSURGICAL) ×2 IMPLANT
GLOVE BIOGEL PI IND STRL 7.0 (GLOVE) ×2 IMPLANT
GLOVE BIOGEL PI INDICATOR 7.0 (GLOVE) ×2
GLOVE SURG SS PI 7.0 STRL IVOR (GLOVE) ×3 IMPLANT
GOWN STRL REUS W/ TWL LRG LVL3 (GOWN DISPOSABLE) ×6 IMPLANT
GOWN STRL REUS W/TWL LRG LVL3 (GOWN DISPOSABLE) ×6
GRASPER SUT TROCAR 14GX15 (MISCELLANEOUS) ×3 IMPLANT
HEMOSTAT SNOW SURGICEL 2X4 (HEMOSTASIS) ×1 IMPLANT
KIT BASIN OR (CUSTOM PROCEDURE TRAY) ×3 IMPLANT
KIT TURNOVER KIT B (KITS) ×3 IMPLANT
NEEDLE 22X1 1/2 (OR ONLY) (NEEDLE) ×3 IMPLANT
PAD ARMBOARD 7.5X6 YLW CONV (MISCELLANEOUS) ×3 IMPLANT
POUCH RETRIEVAL ECOSAC 10 (ENDOMECHANICALS) ×2 IMPLANT
POUCH RETRIEVAL ECOSAC 10MM (ENDOMECHANICALS) ×2
SCISSORS LAP 5X35 DISP (ENDOMECHANICALS) ×3 IMPLANT
SET IRRIG TUBING LAPAROSCOPIC (IRRIGATION / IRRIGATOR) ×3 IMPLANT
SET TUBE SMOKE EVAC HIGH FLOW (TUBING) ×3 IMPLANT
SLEEVE ENDOPATH XCEL 5M (ENDOMECHANICALS) ×6 IMPLANT
SPECIMEN JAR SMALL (MISCELLANEOUS) ×3 IMPLANT
SUT MNCRL AB 4-0 PS2 18 (SUTURE) ×3 IMPLANT
TOWEL GREEN STERILE (TOWEL DISPOSABLE) ×3 IMPLANT
TRAY LAPAROSCOPIC MC (CUSTOM PROCEDURE TRAY) ×3 IMPLANT
TROCAR BALLN 12MMX100 BLUNT (TROCAR) ×1 IMPLANT
TROCAR XCEL NON-BLD 5MMX100MML (ENDOMECHANICALS) ×3 IMPLANT

## 2022-05-14 NOTE — Anesthesia Procedure Notes (Signed)
Procedure Name: Intubation Date/Time: 05/14/2022 8:26 AM Performed by: [name], CRNA Pre-anesthesia Checklist: Patient identified, Emergency Drugs available, Suction available, Patient being monitored and Timeout performed Patient Re-evaluated:Patient Re-evaluated prior to induction Oxygen Delivery Method: Circle system utilized Preoxygenation: Pre-oxygenation with 100% oxygen Induction Type: IV induction Ventilation: Mask ventilation without difficulty Laryngoscope Size: Mac and 4 Grade View: Grade I Tube type: Oral Tube size: 7.5 mm Number of attempts: 1 Airway Equipment and Method: Stylet Placement Confirmation: ETT inserted through vocal cords under direct vision, positive ETCO2, CO2 detector and breath sounds checked- equal and bilateral Secured at: 23 cm Tube secured with: Tape Dental Injury: Teeth and Oropharynx as per pre-operative assessment

## 2022-05-14 NOTE — Anesthesia Preprocedure Evaluation (Addendum)
Anesthesia Evaluation  Patient identified by MRN, date of birth, ID band Patient awake    Reviewed: Allergy & Precautions, H&P , NPO status , Patient's Chart, lab work & pertinent test results  Airway Mallampati: II  TM Distance: >3 FB Neck ROM: Full    Dental no notable dental hx. (+) Teeth Intact, Dental Advisory Given   Pulmonary neg pulmonary ROS,    Pulmonary exam normal breath sounds clear to auscultation       Cardiovascular hypertension, + CAD, + Past MI and + Cardiac Stents  negative cardio ROS   Rhythm:Regular Rate:Normal     Neuro/Psych negative neurological ROS  negative psych ROS   GI/Hepatic negative GI ROS, Neg liver ROS,   Endo/Other  negative endocrine ROSdiabetes, Type 2, Oral Hypoglycemic Agents  Renal/GU negative Renal ROS  negative genitourinary   Musculoskeletal   Abdominal   Peds  Hematology negative hematology ROS (+)   Anesthesia Other Findings   Reproductive/Obstetrics negative OB ROS                            Anesthesia Physical Anesthesia Plan  ASA: 3  Anesthesia Plan: General   Post-op Pain Management: Ofirmev IV (intra-op)*   Induction: Intravenous  PONV Risk Score and Plan: 3 and Ondansetron, Dexamethasone and Midazolam  Airway Management Planned: Oral ETT  Additional Equipment:   Intra-op Plan:   Post-operative Plan: Extubation in OR  Informed Consent: I have reviewed the patients History and Physical, chart, labs and discussed the procedure including the risks, benefits and alternatives for the proposed anesthesia with the patient or authorized representative who has indicated his/her understanding and acceptance.     Dental advisory given  Plan Discussed with: CRNA  Anesthesia Plan Comments:         Anesthesia Quick Evaluation

## 2022-05-14 NOTE — Transfer of Care (Signed)
Immediate Anesthesia Transfer of Care Note  Patient: Benjamin Riley  Procedure(s) Performed: LAPAROSCOPIC CHOLECYSTECTOMY  Patient Location: PACU  Anesthesia Type:General  Level of Consciousness: awake, alert  and oriented  Airway & Oxygen Therapy: Patient Spontanous Breathing and Patient connected to nasal cannula oxygen  Post-op Assessment: Report given to RN and Post -op Vital signs reviewed and stable  Post vital signs: Reviewed and stable  Last Vitals:  Vitals Value Taken Time  BP 133/74 05/14/22 0952  Temp    Pulse 81 05/14/22 0954  Resp 17 05/14/22 0954  SpO2 100 % 05/14/22 0954  Vitals shown include unvalidated device data.  Last Pain:  Vitals:   05/14/22 0741  TempSrc:   PainSc: 2       Patients Stated Pain Goal: 0 (05/13/22 2104)  Complications: No notable events documented.

## 2022-05-14 NOTE — Anesthesia Postprocedure Evaluation (Signed)
Anesthesia Post Note  Patient: Benjamin Riley  Procedure(s) Performed: LAPAROSCOPIC CHOLECYSTECTOMY     Patient location during evaluation: PACU Anesthesia Type: General Level of consciousness: awake and alert Pain management: pain level controlled Vital Signs Assessment: post-procedure vital signs reviewed and stable Respiratory status: spontaneous breathing, nonlabored ventilation and respiratory function stable Cardiovascular status: blood pressure returned to baseline and stable Postop Assessment: no apparent nausea or vomiting Anesthetic complications: no   No notable events documented.  Last Vitals:  Vitals:   05/14/22 1051 05/14/22 1132  BP: 136/71 139/60  Pulse: 64 (!) 58  Resp: 13 13  Temp: 36.7 C 37 C  SpO2: 95% 90%    Last Pain:  Vitals:   05/14/22 1132  TempSrc: Oral  PainSc:                  Lola Lofaro,W. EDMOND

## 2022-05-14 NOTE — Progress Notes (Signed)
Pre Procedure note for inpatients:   Benjamin Riley has been scheduled for Procedure(s): LAPAROSCOPIC CHOLECYSTECTOMY (N/A) today. The various methods of treatment have been discussed with the patient. After consideration of the risks, benefits and treatment options the patient has consented to the planned procedure.   The patient has been seen and labs reviewed. There are no changes in the patient's condition to prevent proceeding with the planned procedure today.  Recent labs:  Lab Results  Component Value Date   WBC 16.2 (H) 05/13/2022   HGB 11.5 (L) 05/13/2022   HCT 35.2 (L) 05/13/2022   PLT 260 05/13/2022   GLUCOSE 116 (H) 05/13/2022   ALT 15 05/13/2022   AST 16 05/13/2022   NA 133 (L) 05/13/2022   K 3.7 05/13/2022   CL 103 05/13/2022   CREATININE 0.95 05/13/2022   BUN 13 05/13/2022   CO2 24 05/13/2022   INR 0.95 08/23/2017   HGBA1C 7.5 (H) 05/11/2022    Rodman Pickle, MD 05/14/2022 8:07 AM

## 2022-05-14 NOTE — Progress Notes (Signed)
Dr. Sheliah Hatch made aware pt is complaining of increasing R upper abd pain, very tender to touch, and is harder than other quadrants with morphine and oxycodone. No new orders given. Pt is passing gas and vitals stable. Cont to monitor. Emelda Brothers RN

## 2022-05-14 NOTE — Op Note (Signed)
PATIENT:  Benjamin Riley  61 y.o. male  PRE-OPERATIVE DIAGNOSIS:  Acute Cholecystitis  POST-OPERATIVE DIAGNOSIS:  Acute Cholecystitis  PROCEDURE:  Procedure(s): LAPAROSCOPIC CHOLECYSTECTOMY   SURGEON:  Rayvin Abid, De Blanch, MD   ASSISTANT: none  ANESTHESIA:   local and general  Indications for procedure: Benjamin Riley is a 61 y.o. male with symptoms of Abdominal pain and Nausea and vomiting consistent with gallbladder disease, Confirmed by ultrasound.  Description of procedure: The patient was brought into the operative suite, placed supine. Anesthesia was administered with endotracheal tube. Patient was strapped in place and foot board was secured. All pressure points were offloaded by foam padding. The patient was prepped and draped in the usual sterile fashion.  A periumbilical incision was made and optical entry was used to enter the abdomen. 2 5 mm trocars were placed on in the right lateral space on in the right subcostal space. A 39mm trocar was placed in the subxiphoid space. Marcaine was infused to the subxiphoid space and lateral upper right abdomen in the transversus abdominis plane. Next the patient was placed in reverse trendelenberg. The gallbladder appearedacutely inflamed and gangrenous. Omentum was adhered to the gallbladder and was taken down with cautery/blunt dissection, Due to the level of dilation, the gallbladder was evacuated with suction, and clear fluid and pus was evacuated .  The gallbladder was retracted cephalad and lateral. The peritoneum was reflected off the infundibulum working lateral to medial. The cystic duct and cystic artery were identified and further dissection revealed a critical view. There was a posterior artery that bled and was doubly clipped. The cystic duct and cystic artery were doubly clipped and ligated.   The gallbladder was removed off the liver bed with cautery. The Gallbladder was placed in a specimen bag. The gallbladder fossa was  irrigated and hemostasis was applied with cautery. The gallbladder was removed via the 61mm trocar. The fascial defect was closed with interrupted 0 vicryl suture via laparoscopic trans-fascial suture passer. Pneumoperitoneum was removed, all trocar were removed. All incisions were closed with 4-0 monocryl subcuticular stitch. The patient woke from anesthesia and was brought to PACU in stable condition. All counts were correct  Findings: gangrenous cholecystitis  Specimen: gallbladder  Blood loss: 100 ml  Local anesthesia: 30 ml Marcaine  Complications: none  PLAN OF CARE: Admit to inpatient   PATIENT DISPOSITION:  PACU - hemodynamically stable.  De Blanch [facility], [place]

## 2022-05-14 NOTE — Progress Notes (Signed)
PROGRESS NOTE  Benjamin Riley  DOB: 1961/10/19  PCP: [name], MD QHU:[id number]  DOA: 05/11/2022  LOS: 1 day  Hospital Day: 4  Brief narrative: Benjamin Riley is a 61 y.o. male with PMH significant for HTN, HLD, DM 2, CAD/MI s/p LAD stent 12 years ago. Patient presented to ED on 5/31 after waking up with epigastric pain, nausea, and vomited about a dozen times. Labs in the ED showed WBC count elevated to 15.6, hemoglobin 13.4, normal LFTs, normal lipase. CTAP showed a 8 mm calcified gallstone, distal colonic diverticulosis, bilateral inguinal hernias containing fat and prostate enlargement. Admitted to hospitalist service GI was consulted 6/2, patient underwent EGD which showed mild gastritis, Mallory-Weiss tears.  Started on PPI 6/2, abdominal ultrasound showed cholelithiasis.  General surgery planned cholecystectomy. See below for details  Subjective: Patient was seen and examined this morning Chart reviewed Hemodynamically stable Blood sugar consistently less than 150.  No labs this morning  Principal Problem:   Epigastric pain Active Problems:   Diabetes mellitus type 2, noninsulin dependent (HCC)   Mixed hyperlipidemia   Status post coronary artery stent placement   Obesity (BMI 30-39.9)   Essential hypertension   Elevated troponin   Calculus of gallbladder without cholecystitis without obstruction   Nausea and vomiting   Mallory-Weiss tear   Gastritis without bleeding    Assessment and Plan: Cholelithiasis with cholecystitis  -Patient presented with acute epigastric pain, vomiting.  Similar self-limiting symptoms in the past.   -EGD with mild gastritis and Mallory-Weiss tears.   -Ultrasound right upper quadrant with cholelithiasis with gallbladder wall thickening.   -Per general surgery, patient seems to have symptomatic biliary disease with cholecystitis, evidenced as recurrent abdominal symptoms, leukocytosis.   -Patient was started on IV Rocephin   -Plan for laparoscopic cholecystectomy today 6/3.    Mallory-Weiss tears/mild gastritis -EGD as above.  Recommended PPI.  Follow-up as an outpatient.    Type 2 diabetes mellitus -A1c 7.5 on 05/11/2022 -Home meds include Ozempic 2 mg weekly, Jardiance 10 mg daily, glipizide 10 mg daily, metformin 5 mg twice daily, Actos 30 mg daily.  Unclear if patient was compliant to all his medications because currently he is only on sliding scale insulin with Accu-Cheks only  -Blood sugar level consistently less than 150 Recent Labs  Lab 05/13/22 1153 05/13/22 1633 05/14/22 0735 05/14/22 0953 05/14/22 1130  GLUCAP 113* 121* 128* 172* 160*   Essential hypertension -Continue losartan   Elevated troponin  CAD/remote history of LAD stent  Mixed hyperlipidemia -Initial troponin elevated to 20s.  Seen by cardiology.  No evidence of EKG changes.  Less likely to be ischemic cardiac event. -Currently on aggressive medical management with aspirin, Zetia, Repatha Recent Labs    05/11/22 1505 05/11/22 2005 05/11/22 2317  TROPONINIHS 30* 28* 27*   Goals of care   Code Status: Full Code    Mobility: Encourage ambulation  Skin assessment:     Nutritional status:  Body mass index is 33.72 kg/m.          Diet:  Diet Order             Diet Carb Modified Fluid consistency: Thin; Room service appropriate? Yes  Diet effective now                   DVT prophylaxis:  Place and maintain sequential compression device Start: 05/11/22 2219   Antimicrobials: IV Rocephin Fluid: None Consultants: General surgery Family Communication: None at bedside  Status is:  Inpatient  Continue in-hospital care because: POD 0 Level of care: Telemetry Medical   Dispo: The patient is from: Home              Anticipated d/c is to: Hopefully home in 2 to 3 days              Patient currently is not medically stable to d/c.   Difficult to place patient No     Infusions:     Scheduled  Meds:  amisulpride       vitamin C  1,000 mg Oral Daily   aspirin EC  81 mg Oral Daily   cholecalciferol  2,000 Units Oral Daily   ezetimibe  10 mg Oral Daily   insulin aspart  0-9 Units Subcutaneous TID WC   loratadine  10 mg Oral Daily   losartan  50 mg Oral Daily   pantoprazole  40 mg Oral Q0600   vitamin B-12  2,000 mcg Oral Daily    PRN meds: HYDROmorphone (DILAUDID) injection, morphine injection, nitroGLYCERIN, ondansetron (ZOFRAN) IV, oxyCODONE   Antimicrobials: Anti-infectives (From admission, onward)    Start     Dose/Rate Route Frequency Ordered Stop   05/13/22 1515  cefTRIAXone (ROCEPHIN) 2 g in sodium chloride 0.9 % 100 mL IVPB  Status:  Discontinued        2 g 200 mL/hr over 30 Minutes Intravenous Every 24 hours 05/13/22 1417 05/14/22 1111   05/13/22 1500  cefTRIAXone (ROCEPHIN) 1 g in sodium chloride 0.9 % 100 mL IVPB  Status:  Discontinued        1 g 200 mL/hr over 30 Minutes Intravenous Every 24 hours 05/13/22 1411 05/13/22 1417       Objective: Vitals:   05/14/22 1417 05/14/22 1430  BP:    Pulse:  (!) 54  Resp:  10  Temp:    SpO2: 92% 92%    Intake/Output Summary (Last 24 hours) at 05/14/2022 1457 Last data filed at 05/14/2022 0947 Gross per 24 hour  Intake 1350 ml  Output 100 ml  Net 1250 ml   Filed Weights   05/11/22 1251 05/14/22 0500 05/14/22 0733  Weight: 106.6 kg 103 kg 106.6 kg   Weight change:  Body mass index is 33.72 kg/m.   Physical Exam: General exam: Pleasant, middle-aged male.  Not in distress Skin: No rashes, lesions or ulcers. HEENT: Atraumatic, normocephalic, no obvious bleeding Lungs: Clear to auscultation bilaterally CVS: Regular rate and rhythm, no murmur GI/Abd soft, right upper quadrant tenderness.  Bowel sounds CNS: Alert, awake, oriented x3 Psychiatry: Mood appropriate Extremities: No pedal edema, no calf tenderness.  Data Review: I have personally reviewed the laboratory data and studies available.  F/u labs  ordered Unresulted Labs (From admission, onward)     Start     Ordered   05/15/22 0500  CBC  Tomorrow morning,   R       Question:  Specimen collection method  Answer:  Lab=Lab collect   05/14/22 1111   05/13/22 0500  CBC  Tomorrow morning,   R       Question:  Specimen collection method  Answer:  Lab=Lab collect   05/12/22 1412   Unscheduled  Basic metabolic panel  Daily,   R     Question:  Specimen collection method  Answer:  Lab=Lab collect   05/14/22 1457            Signed, Lorin Glass, MD [facility] 05/14/2022

## 2022-05-15 ENCOUNTER — Encounter (HOSPITAL_COMMUNITY): Payer: Self-pay | Admitting: General Surgery

## 2022-05-15 DIAGNOSIS — R1013 Epigastric pain: Secondary | ICD-10-CM | POA: Diagnosis not present

## 2022-05-15 LAB — CBC
HCT: 33.3 % — ABNORMAL LOW (ref 39.0–52.0)
Hemoglobin: 11.6 g/dL — ABNORMAL LOW (ref 13.0–17.0)
MCH: 32.9 pg (ref 26.0–34.0)
MCHC: 34.8 g/dL (ref 30.0–36.0)
MCV: 94.3 fL (ref 80.0–100.0)
Platelets: 322 10*3/uL (ref 150–400)
RBC: 3.53 MIL/uL — ABNORMAL LOW (ref 4.22–5.81)
RDW: 13.2 % (ref 11.5–15.5)
WBC: 13.2 10*3/uL — ABNORMAL HIGH (ref 4.0–10.5)
nRBC: 0 % (ref 0.0–0.2)

## 2022-05-15 LAB — BASIC METABOLIC PANEL
Anion gap: 10 (ref 5–15)
BUN: 17 mg/dL (ref 6–20)
CO2: 21 mmol/L — ABNORMAL LOW (ref 22–32)
Calcium: 8.4 mg/dL — ABNORMAL LOW (ref 8.9–10.3)
Chloride: 101 mmol/L (ref 98–111)
Creatinine, Ser: 0.9 mg/dL (ref 0.61–1.24)
GFR, Estimated: >60 mL/min (ref >60)
Glucose, Bld: 173 mg/dL — ABNORMAL HIGH (ref 70–99)
Potassium: 3.8 mmol/L (ref 3.5–5.1)
Sodium: 132 mmol/L — ABNORMAL LOW (ref 135–145)

## 2022-05-15 LAB — GLUCOSE, CAPILLARY
Glucose-Capillary: 169 mg/dL — ABNORMAL HIGH (ref 70–99)
Glucose-Capillary: 199 mg/dL — ABNORMAL HIGH (ref 70–99)

## 2022-05-15 MED ORDER — OXYCODONE HCL 5 MG PO TABS: 5.0000 mg | ORAL_TABLET | Freq: Four times a day (QID) | ORAL | 0 refills | Status: AC | PRN

## 2022-05-15 MED ORDER — AMOXICILLIN-POT CLAVULANATE 875-125 MG PO TABS: 1.0000 | ORAL_TABLET | Freq: Two times a day (BID) | ORAL | 0 refills | Status: DC

## 2022-05-15 MED ORDER — GLIPIZIDE 10 MG PO TABS
10.0000 mg | ORAL_TABLET | Freq: Every day | ORAL | Status: DC
  Administered 2022-05-15: 10 mg via ORAL
  Filled 2022-05-15: qty 1

## 2022-05-15 NOTE — Progress Notes (Signed)
1 Day Post-Op   Subjective/Chief Complaint: Complains only of some soreness but feels much better   Objective: Vital signs in last 24 hours: Temp:  [97.2 F (36.2 C)-99.1 F (37.3 C)] 98 F (36.7 C) (06/04 0438) Pulse Rate:  [54-78] 67 (06/04 0438) Resp:  [9-20] 19 (06/04 0438) BP: (122-157)/(60-81) 138/69 (06/04 0438) SpO2:  [87 %-99 %] 96 % (06/04 0438) Last BM Date : 05/11/22  Intake/Output from previous day: 06/03 0701 - 06/04 0700 In: 900 [I.V.:800; IV Piggyback:100] Out: 100 [Blood:100] Intake/Output this shift: No intake/output data recorded.  General appearance: alert and cooperative Resp: clear to auscultation bilaterally Cardio: regular rate and rhythm GI: soft, mild tenderness. Incisions look good  Lab Results:  Recent Labs    05/13/22 0059 05/15/22 0251  WBC 16.2* 13.2*  HGB 11.5* 11.6*  HCT 35.2* 33.3*  PLT 260 322   BMET Recent Labs    05/13/22 0059 05/15/22 0251  NA 133* 132*  K 3.7 3.8  CL 103 101  CO2 24 21*  GLUCOSE 116* 173*  BUN 13 17  CREATININE 0.95 0.90  CALCIUM 8.6* 8.4*   PT/INR No results for input(s): LABPROT, INR in the last 72 hours. ABG No results for input(s): PHART, HCO3 in the last 72 hours.  Invalid input(s): PCO2, PO2  Studies/Results: No results found.  Anti-infectives: Anti-infectives (From admission, onward)    Start     Dose/Rate Route Frequency Ordered Stop   05/13/22 1515  cefTRIAXone (ROCEPHIN) 2 g in sodium chloride 0.9 % 100 mL IVPB  Status:  Discontinued        2 g 200 mL/hr over 30 Minutes Intravenous Every 24 hours 05/13/22 1417 05/14/22 1111   05/13/22 1500  cefTRIAXone (ROCEPHIN) 1 g in sodium chloride 0.9 % 100 mL IVPB  Status:  Discontinued        1 g 200 mL/hr over 30 Minutes Intravenous Every 24 hours 05/13/22 1411 05/13/22 1417       Assessment/Plan: s/p Procedure(s): LAPAROSCOPIC CHOLECYSTECTOMY (N/A) Advance diet Discharge  LOS: 2 days    [name] 05/15/2022

## 2022-05-15 NOTE — Discharge Summary (Signed)
Physician Discharge Summary  Benjamin Riley [id number] DOB: September 11, 1961 DOA: 05/11/2022  PCP: Marcene Corning, MD  Admit date: 05/11/2022 Discharge date: 05/15/2022  Admitted From: Home Discharge disposition: Home  Brief narrative: Benjamin Riley is a 61 y.o. male with PMH significant for HTN, HLD, DM 2, CAD/MI s/p LAD stent 12 years ago. Patient presented to ED on 5/31 after waking up with epigastric pain, nausea, and vomited about a dozen times. Labs in the ED showed WBC count elevated to 15.6, hemoglobin 13.4, normal LFTs, normal lipase. CTAP showed a 8 mm calcified gallstone, distal colonic diverticulosis, bilateral inguinal hernias containing fat and prostate enlargement. Admitted to hospitalist service GI was consulted 6/2, patient underwent EGD which showed mild gastritis, Mallory-Weiss tears.  Started on PPI 6/2, abdominal ultrasound showed cholelithiasis.  General surgery planned cholecystectomy. See below for details  Subjective: Patient was seen and examined this morning Lying down in bed.  Not in distress.  Pain controlled.  Eating okay.  Bowel function returned.  Seen by general surgery today.  Cleared for discharge.  Wife at bedside.  Patient and his wife agreeable to the plan.  Principal Problem:   Epigastric pain Active Problems:   Diabetes mellitus type 2, noninsulin dependent (HCC)   Mixed hyperlipidemia   Status post coronary artery stent placement   Obesity (BMI 30-39.9)   Essential hypertension   Elevated troponin   Calculus of gallbladder without cholecystitis without obstruction   Nausea and vomiting   Mallory-Weiss tear   Gastritis without bleeding    Hospital course: Cholelithiasis with cholecystitis  -Patient presented with acute epigastric pain, vomiting.  Similar self-limiting symptoms in the past.   -EGD with mild gastritis and Mallory-Weiss tears.   -Ultrasound right upper quadrant with cholelithiasis with gallbladder wall thickening.    -Per general surgery, patient had symptomatic biliary disease with cholecystitis, evidenced as recurrent abdominal symptoms, leukocytosis.   -6/3, patient underwent laparoscopic cholecystectomy.  He was found to have gangrenous cholecystitis. -Preoperatively, he was given IV Rocephin.  WBC count trending down.  Discussed with surgeon Dr. Kieth Brightly this morning. No need of antibiotics at discharge. Recent Labs  Lab 05/11/22 1257 05/12/22 0218 05/13/22 0059 05/15/22 0251  WBC 15.6* 16.9* 16.2* 13.2*   Mallory-Weiss tears/mild gastritis -EGD with mild gastritis, Mallory-Weiss tear.  Recommended PPI.  Follow-up as an outpatient.    Type 2 diabetes mellitus -A1c 7.5 on 05/11/2022 -Home meds include Ozempic 2 mg weekly, Jardiance 10 mg daily, glipizide 10 mg daily, metformin 5 mg twice daily, Actos 30 mg daily.  Unclear if patient was compliant to all his medications because currently he is only on sliding scale insulin with Accu-Cheks only  -In the hospital, blood sugar level was initially low.  It is rising up postoperatively after appetite improved. -Resume home regimen at discharge. Recent Labs  Lab 05/14/22 1130 05/14/22 1554 05/14/22 2158 05/15/22 0759 05/15/22 0954  GLUCAP 160* 208* 207* 169* 199*   Essential hypertension -Continue losartan   Elevated troponin  CAD/remote history of LAD stent  Mixed hyperlipidemia -Initial troponin elevated to 20s.  Seen by cardiology.  No evidence of EKG changes.  Less likely to be ischemic cardiac event. -Currently on aggressive medical management with aspirin, Zetia, RepathaNo results for input(s): CKTOTAL, CKMB, TROPONINIHS, RELINDX in the last 72 hours.  Goals of care   Code Status: Full Code    Mobility: Encourage ambulation  Skin assessment:     Nutritional status:  Body mass index is 33.72 kg/m.  Wounds:  - Incision (Closed) 05/14/22 Abdomen (Active)  Date First Assessed/Time First Assessed: 05/14/22 0734    Location: Abdomen    Assessments 05/14/2022  9:53 AM 05/14/2022  8:00 PM  Dressing Type Liquid skin adhesive Liquid skin adhesive  Dressing Clean, Dry, Intact Clean, Dry, Intact  Site / Wound Assessment Clean;Dry Clean;Dry  Margins Attached edges (approximated) Attached edges (approximated)  Drainage Amount None Scant  Drainage Description -- Serosanguineous  Treatment -- Cleansed     No Linked orders to display     Incision - 4 Ports Abdomen Umbilicus Superior Right;Upper Right;Lateral (Active)  Placement Date/Time: 05/14/22 0857   Location of Ports: Abdomen  Location Orientation: Umbilicus  Location Orientation: Superior  Location Orientation: Right;Upper  Location Orientation: Right;Lateral    Assessments 05/14/2022  9:02 AM 05/14/2022  8:00 PM  Port 1 Site Assessment -- [employer] 1 Margins -- Attached edges (approximated)  Port 1 Drainage Amount -- Minimal  Port 1 Drainage Description -- Serosanguineous  Port 1 Dressing Type Liquid skin adhesive Liquid skin adhesive;Gauze (Comment)  Port 1 Dressing Status -- Clean, Dry, Intact  Port 2 Site Assessment -- Clean;Dry  Port 2 Margins -- Attached edges (approximated)  Port 2 Drainage Amount -- None  Port 2 Drainage Description -- No odor  Port 2 Dressing Type Liquid skin adhesive Liquid skin adhesive  Port 2 Dressing Status -- Clean, Dry, Intact  Port 3 Site Assessment -- Clean;Dry  Port 3 Margins -- Attached edges (approximated)  Port 3 Drainage Amount -- None  Port 3 Drainage Description -- No odor  Port 3 Dressing Type Liquid skin adhesive Liquid skin adhesive  Port 3 Dressing Status -- Clean, Dry, Intact  Port 4 Site Assessment -- Clean;Dry  Port 4 Margins -- Attached edges (approximated)  Port 4 Drainage Amount -- None  Port 4 Drainage Description -- No odor  Port 4 Dressing Type Liquid skin adhesive Liquid skin adhesive  Port 4 Dressing Status -- Clean, Dry, Intact     No Linked orders to display    Discharge Exam:    Vitals:   05/14/22 1417 05/14/22 1430 05/14/22 2135 05/15/22 0438  BP:   132/76 138/69  Pulse:  (!) 54 63 67  Resp:  10 20 19   Temp:   99.1 F (37.3 C) 98 F (36.7 C)  TempSrc:   Oral Oral  SpO2: 92% 92% 94% 96%  Weight:      Height:        Body mass index is 33.72 kg/m.  General exam: Pleasant, middle-aged male.  Not in distress Skin: No rashes, lesions or ulcers. HEENT: Atraumatic, normocephalic, no obvious bleeding Lungs: Clear to auscultation bilaterally CVS: Regular rate and rhythm, no murmur GI/Abd soft, mild appropriate postsurgical abdominal tenderness, bowel sounds present CNS: Alert, awake, oriented x3 Psychiatry: Mood appropriate Extremities: No pedal edema, no calf tenderness.  Follow ups:    Follow-up Information     Nigel Mormon, MD Follow up on 06/03/2022.   Specialties: Cardiology, Radiology Why: 8:45 AM Contact information: [place]  [address] [phone]         [facility] [place]. Schedule an appointment as soon as possible for a visit in 3 week(s).   Specialty: General Surgery Why: Our office is working on scheduling follow up in about 3 weeks. Please call to confirm appointment date/time. Please arrive 30 min prior to appointment time and have ID and insurance information with you. Contact information: [address] [place] [address] [phone]         [name], MD Follow up.   Specialty: Internal Medicine Contact information: [address] [place] [address] [phone]                 Discharge Instructions:   Discharge Instructions     Call MD for:  difficulty breathing, headache or visual disturbances   Complete by: As directed    Call MD for:  extreme fatigue   Complete by: As directed    Call MD for:  hives   Complete by: As directed    Call MD for:  persistant dizziness or light-headedness   Complete by: As directed     Call MD for:  persistant nausea and vomiting   Complete by: As directed    Call MD for:  redness, tenderness, or signs of infection (pain, swelling, redness, odor or green/yellow discharge around incision site)   Complete by: As directed    Call MD for:  severe uncontrolled pain   Complete by: As directed    Call MD for:  temperature >100.4   Complete by: As directed    Diet - low sodium heart healthy   Complete by: As directed    Discharge instructions   Complete by: As directed    May shower. Low fat diet. No heavy lifting   Increase activity slowly   Complete by: As directed    No wound care   Complete by: As directed        Discharge Medications:   Allergies as of 05/15/2022       Reactions   Lisinopril Other (See Comments)   unknown   Statins Other (See Comments)   Leg cramps with simvastatin and atorvastatin         Medication List     TAKE these medications    amoxicillin-clavulanate 875-125 MG tablet Commonly known as: AUGMENTIN Take 1 tablet by mouth 2 (two) times daily.   aspirin EC 81 MG tablet Take 81 mg by mouth daily.   CoQ10 100 MG Caps Take 100 mg by mouth daily.   Cyanocobalamin 2000 MCG Tbcr Take 2,000 mcg by mouth daily.   ezetimibe 10 MG tablet Commonly known as: ZETIA Take 10 mg by mouth daily.   glipiZIDE 10 MG tablet Commonly known as: GLUCOTROL Take 10 mg by mouth daily before breakfast.   Jardiance 10 MG Tabs tablet Generic drug: empagliflozin Take 10 mg by mouth daily.   loratadine 10 MG tablet Commonly known as: CLARITIN Take 10 mg by mouth daily.   losartan 50 MG tablet Commonly known as: COZAAR Take 1 tablet (50 mg total) by mouth daily.   metFORMIN 500 MG 24 hr tablet Commonly known as: GLUCOPHAGE-XR Take 500 mg by mouth 2 (two) times daily.   nitroGLYCERIN 0.4 MG SL tablet Commonly known as: NITROSTAT Place 1 tablet (0.4 mg total) under the tongue every 5 (five) minutes as needed for chest pain.   oxyCODONE 5  MG immediate release tablet Commonly known as: Oxy IR/ROXICODONE Take 1 tablet (5 mg total) by mouth every 6 (six) hours as needed for moderate pain.   Ozempic (2 MG/DOSE) 8 MG/3ML Sopn Generic drug: Semaglutide (2 MG/DOSE) Inject 2 mg into the skin once a week. Sundays   pioglitazone 30 MG tablet Commonly known as: ACTOS Take 30 mg by mouth daily.   Repatha SureClick 140 MG/ML Soaj Generic drug: Evolocumab INJECT 140 MG  UNDER THE SKIN EVERY 2 WEEKS AS DIRECTED What changed: See the new instructions.   vitamin C 1000 MG tablet Take 1,000 mg by mouth daily.   Vitamin D3 50 MCG (2000 UT) capsule Take 2,000 Units by mouth daily.         The results of significant diagnostics from this hospitalization (including imaging, microbiology, ancillary and laboratory) are listed below for reference.    Procedures and Diagnostic Studies:   CT Abdomen Pelvis W Contrast  Result Date: 05/11/2022 CLINICAL DATA:  Acute mid abdominal pain since early this morning, nausea, vomiting. Past history of MI, diabetes mellitus, appendectomy EXAM: CT ABDOMEN AND PELVIS WITH CONTRAST TECHNIQUE: Multidetector CT imaging of the abdomen and pelvis was performed using the standard protocol following bolus administration of intravenous contrast. RADIATION DOSE REDUCTION: This exam was performed according to the departmental dose-optimization program which includes automated exposure control, adjustment of the mA and/or kV according to patient size and/or use of iterative reconstruction technique. CONTRAST:  65mL OMNIPAQUE IOHEXOL 300 MG/ML SOLN IV. No oral contrast. COMPARISON:  None FINDINGS: Lower chest: Minimal dependent atelectasis posterior RIGHT lower lobe. Hepatobiliary: 8 mm calcified gallstone in gallbladder. Gallbladder and liver otherwise normal appearance. Pancreas: Normal appearance Spleen: Normal appearance Adrenals/Urinary Tract: Adrenal glands normal appearance. RIGHT renal cyst 3.2 cm diameter; no  follow-up imaging recommended. Kidneys, ureters, and bladder otherwise normal appearance. Stomach/Bowel: Appendix surgically absent by history. Diverticulosis of descending and sigmoid colon without evidence of diverticulitis. Rectum under distended, unable to exclude wall thickening in this setting. Duodenal diverticulum at cranial aspect of third portion. Remaining bowel loops unremarkable. Stomach normal appearance. Vascular/Lymphatic: Atherosclerotic calcifications aorta and iliac arteries without aneurysm. No adenopathy. Reproductive: Mild prostatic enlargement. Seminal vesicles unremarkable Other: Probable BILATERAL inguinal hernias containing fat. No free air or free fluid. Musculoskeletal: Mild degenerative disc disease changes lower lumbar spine. IMPRESSION: Distal colonic diverticulosis without evidence of diverticulitis. Cholelithiasis. Mild prostatic enlargement. Probable BILATERAL inguinal hernias containing fat. No acute intra-abdominal or intrapelvic process identified. Aortic Atherosclerosis (ICD10-I70.0). Electronically Signed   By: Lavonia Dana M.D.   On: 05/11/2022 14:18   DG Chest Port 1 View  Result Date: 05/11/2022 CLINICAL DATA:  Upper abdominal pain with vomiting since 1 a.m. EXAM: PORTABLE CHEST 1 VIEW COMPARISON:  08/22/2017 FINDINGS: Numerous leads and wires project over the chest. Midline trachea. Borderline cardiomegaly. Mediastinal contours otherwise within normal limits. No pleural effusion or pneumothorax. Clear lungs. IMPRESSION: No acute cardiopulmonary disease. Electronically Signed   By: Abigail Miyamoto M.D.   On: 05/11/2022 14:55   US Abdomen Limited RUQ (LIVER/GB)  Result Date: 05/12/2022 CLINICAL DATA:  Epigastric pain EXAM: ULTRASOUND ABDOMEN LIMITED RIGHT UPPER QUADRANT COMPARISON:  CT 05/11/2022 FINDINGS: Gallbladder: 1.0 cm gallstone and sludge within the gallbladder. Wall thickening measuring up to 6 mm. Negative sonographic Murphy's. Common bile duct: Diameter: Normal  caliber, 4 mm Liver: No focal lesion identified. Within normal limits in parenchymal echogenicity. Portal vein is patent on color Doppler imaging with normal direction of blood flow towards the liver. Other: None. IMPRESSION: Cholelithiasis. Sludge within the gallbladder. Mild gallbladder wall thickening without sonographic Murphy sign. Recommend clinical correlation to exclude acute cholecystitis. Electronically Signed   By: Rolm Baptise M.D.   On: 05/12/2022 19:08     Labs:   Basic Metabolic Panel: Recent Labs  Lab 05/11/22 1257 05/13/22 0059 05/15/22 0251  NA 140 133* 132*  K 4.3 3.7 3.8  CL 99 103 101  CO2 26 24 21*  GLUCOSE 175*  116* 173*  BUN 15 13 17   CREATININE 0.94 0.95 0.90  CALCIUM 10.2 8.6* 8.4*  MG  --  2.2  --   PHOS  --  1.7*  --    GFR Estimated Creatinine Clearance: 106.7 mL/min (by C-G formula based on SCr of 0.9 mg/dL). Liver Function Tests: Recent Labs  Lab 05/11/22 1257 05/13/22 0059  AST 18 16  ALT 20 15  ALKPHOS 90 72  BILITOT 0.4 0.9  PROT 7.8 5.8*  ALBUMIN 4.6 3.2*  3.2*   Recent Labs  Lab 05/11/22 1257  LIPASE 24   No results for input(s): AMMONIA in the last 168 hours. Coagulation profile No results for input(s): INR, PROTIME in the last 168 hours.  CBC: Recent Labs  Lab 05/11/22 1257 05/12/22 0218 05/13/22 0059 05/15/22 0251  WBC 15.6* 16.9* 16.2* 13.2*  NEUTROABS  --   --  13.3*  --   HGB 13.4 12.3* 11.5* 11.6*  HCT 40.3 37.2* 35.2* 33.3*  MCV 95.3 97.6 98.1 94.3  PLT 372 306 260 322   Cardiac Enzymes: No results for input(s): CKTOTAL, CKMB, CKMBINDEX, TROPONINI in the last 168 hours. BNP: Invalid input(s): POCBNP CBG: Recent Labs  Lab 05/14/22 1130 05/14/22 1554 05/14/22 2158 05/15/22 0759 05/15/22 0954  GLUCAP 160* 208* 207* 169* 199*   D-Dimer No results for input(s): DDIMER in the last 72 hours. Hgb A1c No results for input(s): HGBA1C in the last 72 hours. Lipid Profile No results for input(s): CHOL, HDL,  LDLCALC, TRIG, CHOLHDL, LDLDIRECT in the last 72 hours. Thyroid function studies No results for input(s): TSH, T4TOTAL, T3FREE, THYROIDAB in the last 72 hours.  Invalid input(s): FREET3 Anemia work up No results for input(s): VITAMINB12, FOLATE, FERRITIN, TIBC, IRON, RETICCTPCT in the last 72 hours. Microbiology Recent Results (from the past 240 hour(s))  Surgical pcr screen     Status: None   Collection Time: 05/14/22  2:54 AM   Specimen: Nasal Mucosa; Nasal Swab  Result Value Ref Range Status   MRSA, PCR NEGATIVE NEGATIVE Final   Staphylococcus aureus NEGATIVE NEGATIVE Final    Comment: (NOTE) The Xpert SA Assay (FDA approved for NASAL specimens in patients 33 years of age and older), is one component of a comprehensive surveillance program. It is not intended to diagnose infection nor to guide or monitor treatment. Performed at [facility], [place] [address], [place], [place] [address]     Time coordinating discharge: 35 minutes  Signed: Rumaldo Difatta  [facility] 05/15/2022, 10:43 AM

## 2022-05-15 NOTE — Discharge Instructions (Signed)
[facility] LAPAROSCOPIC SURGERY: POST OP INSTRUCTIONS Always review your discharge instruction sheet given to you by the facility where your surgery was performed. IF YOU HAVE DISABILITY OR FAMILY LEAVE FORMS, YOU MUST BRING THEM TO THE OFFICE FOR PROCESSING.   DO NOT GIVE THEM TO YOUR DOCTOR.  PAIN CONTROL  First take acetaminophen (Tylenol) AND/or ibuprofen (Advil) to control your pain after surgery.  Follow directions on package.  Taking acetaminophen (Tylenol) and/or ibuprofen (Advil) regularly after surgery will help to control your pain and lower the amount of prescription pain medication you may need.  You should not take more than 3,000 mg (3 grams) of acetaminophen (Tylenol) in 24 hours.  You should not take ibuprofen (Advil), aleve, motrin, naprosyn or other NSAIDS if you have a history of stomach ulcers or chronic kidney disease.  A prescription for pain medication may be given to you upon discharge.  Take your pain medication as prescribed, if you still have uncontrolled pain after taking acetaminophen (Tylenol) or ibuprofen (Advil). Use ice packs to help control pain. If you need a refill on your pain medication, please contact your pharmacy.  They will contact our office to request authorization. Prescriptions will not be filled after 5pm or on week-ends.  HOME MEDICATIONS Take your usually prescribed medications unless otherwise directed.  DIET You should follow a light diet the first few days after arrival home.  Be sure to include lots of fluids daily. Avoid fatty, fried foods.   CONSTIPATION It is common to experience some constipation after surgery and if you are taking pain medication.  Increasing fluid intake and taking a stool softener (such as Colace) will usually help or prevent this problem from occurring.  A mild laxative (Milk of Magnesia or Miralax) should be taken according to package instructions if there are no bowel movements after 48  hours.  WOUND/INCISION CARE Most patients will experience some swelling and bruising in the area of the incisions.  Ice packs will help.  Swelling and bruising can take several days to resolve.  Unless discharge instructions indicate otherwise, follow guidelines below  STERI-STRIPS - you may remove your outer bandages 48 hours after surgery, and you may shower at that time.  You have steri-strips (small skin tapes) in place directly over the incision.  These strips should be left on the skin for 7-10 days.   DERMABOND/SKIN GLUE - you may shower in 24 hours.  The glue will flake off over the next 2-3 weeks. Any sutures or staples will be removed at the office during your follow-up visit.  ACTIVITIES You may resume regular (light) daily activities beginning the next day--such as daily self-care, walking, climbing stairs--gradually increasing activities as tolerated.  You may have sexual intercourse when it is comfortable.  Refrain from any heavy lifting or straining until approved by your doctor. You may drive when you are no longer taking prescription pain medication, you can comfortably wear a seatbelt, and you can safely maneuver your car and apply brakes.  FOLLOW-UP You should see your doctor in the office for a follow-up appointment approximately 2-3 weeks after your surgery.  You should have been given your post-op/follow-up appointment when your surgery was scheduled.  If you did not receive a post-op/follow-up appointment, make sure that you call for this appointment within a day or two after you arrive home to insure a convenient appointment time.   WHEN TO CALL YOUR DOCTOR: Fever over 101.0 Inability to urinate Continued bleeding from incision.   Increased pain, redness, or drainage from the incision. Increasing abdominal pain  The clinic staff is available to answer your questions during regular business hours.  Please don't hesitate to call and ask to speak to one of the nurses for  clinical concerns.  If you have a medical emergency, go to the nearest emergency room or call 911.  A surgeon from [facility] is always on call at the hospital. [address], [address], [place], [place]  [address] ? [address] [address], [place], [place]   [address] [phone] ? [phone] ? FAX [phone] Web site: [email or web address]  

## 2022-05-15 NOTE — Progress Notes (Signed)
  Transition of Care [facility]) Screening Note   Patient Details  Name: Benjamin Riley Date of Birth: 06/18/1961   Transition of Care [facility] CM/SW Contact:    Bess Kinds, RN Phone Number: [phone] 05/15/2022, 10:48 AM    Transition of Care Department [facility]) has reviewed patient and no TOC needs have been identified at this time. Patient to transition home today.

## 2022-05-16 ENCOUNTER — Encounter (HOSPITAL_COMMUNITY): Payer: Self-pay | Admitting: Internal Medicine

## 2022-05-16 ENCOUNTER — Telehealth: Payer: Self-pay | Admitting: Internal Medicine

## 2022-05-16 ENCOUNTER — Other Ambulatory Visit: Payer: Self-pay

## 2022-05-16 DIAGNOSIS — R197 Diarrhea, unspecified: Secondary | ICD-10-CM

## 2022-05-16 DIAGNOSIS — R110 Nausea: Secondary | ICD-10-CM

## 2022-05-16 DIAGNOSIS — R1013 Epigastric pain: Secondary | ICD-10-CM

## 2022-05-16 DIAGNOSIS — Z9049 Acquired absence of other specified parts of digestive tract: Secondary | ICD-10-CM

## 2022-05-16 LAB — SURGICAL PATHOLOGY

## 2022-05-16 MED ORDER — PANTOPRAZOLE SODIUM 40 MG PO TBEC
40.0000 mg | DELAYED_RELEASE_TABLET | Freq: Every day | ORAL | 1 refills | Status: DC
Start: 2022-05-16 — End: 2022-05-29

## 2022-05-16 NOTE — Telephone Encounter (Signed)
Prescription sent to pharmacy for pt 

## 2022-05-16 NOTE — Telephone Encounter (Signed)
Inbound call from patients wife stating that patient was at [facility] and had procedure with him and was supposed to have Protonix called in for him. Patients wife stated that they did not get any. Patients wife is requesting medication be sent to [facility], [place], [place] [address]  [phone]  Please advise.

## 2022-05-17 LAB — SURGICAL PATHOLOGY

## 2022-05-18 ENCOUNTER — Encounter: Payer: Self-pay | Admitting: Internal Medicine

## 2022-05-19 NOTE — Telephone Encounter (Signed)
Pts wife called and states pt went home Sunday. He saw Dr. Rhea Belton in the hospital and wound up having his gallbladder removed. Since they have been home she states he has been very nauseated. He has not vomited, he is afraid of getting sick. He has been taking the protonix at night, discussed with her he needs to be taking protonix in the am prior to eating anything. Asked how his blood sugar is doing and she states he hasn't taken it today. He has some zofran 8mg  ODT but is only taking it once a day. Wife wants to know what Dr. recommends. Please advise.

## 2022-05-19 NOTE — Telephone Encounter (Signed)
Inbound call from patients wife stating that patient has been really nauseous since he has had procedure. Wife is requesting a call back to discuss. Please advise

## 2022-05-20 NOTE — Telephone Encounter (Signed)
Recent cholecystectomy for what pathology proved to be acute cholecystitis  I agree with continuing the pantoprazole for now.  His gastric biopsies did not show any H. pylori. Certainly okay to use Zofran 4 to 8 mg ODT every 8 hours as needed for nausea and to try to control vomiting Would avoid high-fat foods for about a week or so Monitor for worsening upper abdominal pain and fever and let me and general surgery know if this occurs

## 2022-05-20 NOTE — Telephone Encounter (Signed)
Spoke with pts wife and she is aware of recommendations per Dr. Hilarie Fredrickson.

## 2022-05-23 NOTE — Telephone Encounter (Signed)
I spoke to the patient's wife and the patient by phone 9 days ago he had laparoscopic cholecystectomy after presenting with acute cholecystitis with cholelithiasis  He has continued to struggle with severe nausea but not vomiting.  He feels exhausted.  No fever.  He has bruising around his incisions but the skin glue is intact.  They are not overly erythematous and are not draining. He has pain in his upper abdomen and he states he feels like he has been "kicked in the stomach".  His appetite has been decreased but he has been able to take calories to maintain his blood sugar. He is having diarrhea 4-8 times per day without blood or melena. He took his Ozempic yesterday but has been on this for 18 months.  He has not yet been in touch with [facility]  Zofran is helping with the nausea He has continued pantoprazole  Assessment: 61 year old male with upper abdominal pain, nausea and diarrhea 9 days after laparoscopic cholecystectomy for acute cholecystitis  Plan: 1.  Patient to come to our lab tomorrow for CBC, CMP and C. difficile PCR stool test 2.  Please order CT scan abdomen pelvis with IV contrast; rule out complications after laparoscopic cholecystectomy for epigastric abdominal pain 3.  If symptoms persist consider holding dose of Ozempic next Sunday; this is definitely not the cause of symptoms but may impair gastric emptying given what he is dealing with postsurgery 4.  Stop pantoprazole --unlikely cause of diarrhea but it may be contributing; if after stopping it he deems it was definitively helping he can resume it 5.  Continue Zofran 4 to 8 mg every 8 hours as needed nausea 6.  I asked the patient to [facility] regarding his symptoms

## 2022-05-23 NOTE — Addendum Note (Signed)
Addended by: Beverley Fiedler on: 05/23/2022 05:26 PM   Modules accepted: Orders

## 2022-05-23 NOTE — Telephone Encounter (Signed)
Pts wife calling back stating pt is not any better. Reports he has no energy, still nauseated and he is taking the zofran. Any time he eats something it runs right through him, he has diarrhea. States he has lost 15 pounds. C/O extreme soreness on the larger incision near his sternum. He just feels so tired and still feels "foggy." No fever. Please advise.

## 2022-05-23 NOTE — Telephone Encounter (Addendum)
Patients wife calling regarding the patient still having gallbladder issues requesting to speak with a nurse before you leave today.

## 2022-05-24 ENCOUNTER — Other Ambulatory Visit (INDEPENDENT_AMBULATORY_CARE_PROVIDER_SITE_OTHER): Payer: No Typology Code available for payment source

## 2022-05-24 ENCOUNTER — Other Ambulatory Visit: Payer: Self-pay

## 2022-05-24 ENCOUNTER — Other Ambulatory Visit: Payer: No Typology Code available for payment source

## 2022-05-24 DIAGNOSIS — R197 Diarrhea, unspecified: Secondary | ICD-10-CM | POA: Diagnosis not present

## 2022-05-24 DIAGNOSIS — R1013 Epigastric pain: Secondary | ICD-10-CM | POA: Diagnosis not present

## 2022-05-24 DIAGNOSIS — Z9049 Acquired absence of other specified parts of digestive tract: Secondary | ICD-10-CM

## 2022-05-24 DIAGNOSIS — R110 Nausea: Secondary | ICD-10-CM

## 2022-05-24 LAB — CBC WITH DIFFERENTIAL/PLATELET
Basophils Absolute: 0.1 10*3/uL (ref 0.0–0.1)
Basophils Relative: 1.3 % (ref 0.0–3.0)
Eosinophils Absolute: 0.3 10*3/uL (ref 0.0–0.7)
Eosinophils Relative: 2.6 % (ref 0.0–5.0)
HCT: 36.7 % — ABNORMAL LOW (ref 39.0–52.0)
Hemoglobin: 12.2 g/dL — ABNORMAL LOW (ref 13.0–17.0)
Lymphocytes Relative: 17.7 % (ref 12.0–46.0)
Lymphs Abs: 1.9 10*3/uL (ref 0.7–4.0)
MCHC: 33.2 g/dL (ref 30.0–36.0)
MCV: 94.9 fl (ref 78.0–100.0)
Monocytes Absolute: 0.7 10*3/uL (ref 0.1–1.0)
Monocytes Relative: 6.1 % (ref 3.0–12.0)
Neutro Abs: 7.8 10*3/uL — ABNORMAL HIGH (ref 1.4–7.7)
Neutrophils Relative %: 72.3 % (ref 43.0–77.0)
Platelets: 554 10*3/uL — ABNORMAL HIGH (ref 150.0–400.0)
RBC: 3.86 Mil/uL — ABNORMAL LOW (ref 4.22–5.81)
RDW: 13.6 % (ref 11.5–15.5)
WBC: 10.8 10*3/uL — ABNORMAL HIGH (ref 4.0–10.5)

## 2022-05-24 LAB — COMPREHENSIVE METABOLIC PANEL
ALT: 26 U/L (ref 0–53)
AST: 20 U/L (ref 0–37)
Albumin: 3.8 g/dL (ref 3.5–5.2)
Alkaline Phosphatase: 96 U/L (ref 39–117)
BUN: 9 mg/dL (ref 6–23)
CO2: 27 mEq/L (ref 19–32)
Calcium: 9.6 mg/dL (ref 8.4–10.5)
Chloride: 102 mEq/L (ref 96–112)
Creatinine, Ser: 0.78 mg/dL (ref 0.40–1.50)
GFR: 97.17 mL/min (ref >60.00)
Glucose, Bld: 159 mg/dL — ABNORMAL HIGH (ref 70–99)
Potassium: 4 mEq/L (ref 3.5–5.1)
Sodium: 139 mEq/L (ref 135–145)
Total Bilirubin: 0.3 mg/dL (ref 0.2–1.2)
Total Protein: 6.8 g/dL (ref 6.0–8.3)

## 2022-05-24 NOTE — Telephone Encounter (Signed)
Orders in for labs. Order in for CT of A/P, rad scheduling to contact pt regarding scheduling CT scan of A/P.

## 2022-05-25 ENCOUNTER — Other Ambulatory Visit: Payer: Self-pay

## 2022-05-25 DIAGNOSIS — R110 Nausea: Secondary | ICD-10-CM

## 2022-05-25 DIAGNOSIS — R1013 Epigastric pain: Secondary | ICD-10-CM

## 2022-05-25 NOTE — Telephone Encounter (Signed)
Patients wife calling to follow up on her husbands treatment plan. States the patient is still in a lot of pain and discomfort.

## 2022-05-26 ENCOUNTER — Inpatient Hospital Stay (HOSPITAL_COMMUNITY)
Admission: EM | Admit: 2022-05-26 | Discharge: 2022-05-29 | DRG: 394 | Disposition: A | Discharge status: Home / Self Care | Payer: No Typology Code available for payment source | Attending: Internal Medicine | Admitting: Internal Medicine

## 2022-05-26 ENCOUNTER — Other Ambulatory Visit: Payer: Self-pay

## 2022-05-26 ENCOUNTER — Ambulatory Visit
Admission: RE | Admit: 2022-05-26 | Discharge: 2022-05-26 | Disposition: A | Discharge status: Home / Self Care | Payer: No Typology Code available for payment source | Source: Ambulatory Visit | Attending: Internal Medicine | Admitting: Internal Medicine

## 2022-05-26 ENCOUNTER — Encounter (HOSPITAL_COMMUNITY): Payer: Self-pay

## 2022-05-26 ENCOUNTER — Telehealth: Payer: Self-pay | Admitting: Internal Medicine

## 2022-05-26 ENCOUNTER — Telehealth: Payer: Self-pay | Admitting: Gastroenterology

## 2022-05-26 DIAGNOSIS — R110 Nausea: Secondary | ICD-10-CM

## 2022-05-26 DIAGNOSIS — Z7984 Long term (current) use of oral hypoglycemic drugs: Secondary | ICD-10-CM

## 2022-05-26 DIAGNOSIS — D75839 Thrombocytosis, unspecified: Secondary | ICD-10-CM | POA: Diagnosis present

## 2022-05-26 DIAGNOSIS — Z888 Allergy status to other drugs, medicaments and biological substances status: Secondary | ICD-10-CM

## 2022-05-26 DIAGNOSIS — Z8249 Family history of ischemic heart disease and other diseases of the circulatory system: Secondary | ICD-10-CM | POA: Diagnosis not present

## 2022-05-26 DIAGNOSIS — Z823 Family history of stroke: Secondary | ICD-10-CM

## 2022-05-26 DIAGNOSIS — R197 Diarrhea, unspecified: Secondary | ICD-10-CM | POA: Diagnosis present

## 2022-05-26 DIAGNOSIS — R188 Other ascites: Secondary | ICD-10-CM | POA: Diagnosis present

## 2022-05-26 DIAGNOSIS — I10 Essential (primary) hypertension: Secondary | ICD-10-CM | POA: Diagnosis present

## 2022-05-26 DIAGNOSIS — Z79899 Other long term (current) drug therapy: Secondary | ICD-10-CM

## 2022-05-26 DIAGNOSIS — Y838 Other surgical procedures as the cause of abnormal reaction of the patient, or of later complication, without mention of misadventure at the time of the procedure: Secondary | ICD-10-CM | POA: Diagnosis present

## 2022-05-26 DIAGNOSIS — E785 Hyperlipidemia, unspecified: Secondary | ICD-10-CM | POA: Diagnosis present

## 2022-05-26 DIAGNOSIS — Z955 Presence of coronary angioplasty implant and graft: Secondary | ICD-10-CM

## 2022-05-26 DIAGNOSIS — Z7982 Long term (current) use of aspirin: Secondary | ICD-10-CM

## 2022-05-26 DIAGNOSIS — Z808 Family history of malignant neoplasm of other organs or systems: Secondary | ICD-10-CM | POA: Diagnosis not present

## 2022-05-26 DIAGNOSIS — I252 Old myocardial infarction: Secondary | ICD-10-CM

## 2022-05-26 DIAGNOSIS — I2510 Atherosclerotic heart disease of native coronary artery without angina pectoris: Secondary | ICD-10-CM | POA: Diagnosis present

## 2022-05-26 DIAGNOSIS — Z9049 Acquired absence of other specified parts of digestive tract: Secondary | ICD-10-CM

## 2022-05-26 DIAGNOSIS — E119 Type 2 diabetes mellitus without complications: Secondary | ICD-10-CM | POA: Diagnosis present

## 2022-05-26 DIAGNOSIS — Z794 Long term (current) use of insulin: Secondary | ICD-10-CM

## 2022-05-26 DIAGNOSIS — R1013 Epigastric pain: Secondary | ICD-10-CM

## 2022-05-26 DIAGNOSIS — K9189 Other postprocedural complications and disorders of digestive system: Principal | ICD-10-CM | POA: Diagnosis present

## 2022-05-26 LAB — CBC WITH DIFFERENTIAL/PLATELET
Abs Immature Granulocytes: 0.18 10*3/uL — ABNORMAL HIGH (ref 0.00–0.07)
Basophils Absolute: 0.1 10*3/uL (ref 0.0–0.1)
Basophils Relative: 1 %
Eosinophils Absolute: 0.4 10*3/uL (ref 0.0–0.5)
Eosinophils Relative: 4 %
HCT: 36.6 % — ABNORMAL LOW (ref 39.0–52.0)
Hemoglobin: 12 g/dL — ABNORMAL LOW (ref 13.0–17.0)
Immature Granulocytes: 2 %
Lymphocytes Relative: 22 %
Lymphs Abs: 2.2 10*3/uL (ref 0.7–4.0)
MCH: 32.3 pg (ref 26.0–34.0)
MCHC: 32.8 g/dL (ref 30.0–36.0)
MCV: 98.4 fL (ref 80.0–100.0)
Monocytes Absolute: 0.8 10*3/uL (ref 0.1–1.0)
Monocytes Relative: 7 %
Neutro Abs: 6.7 10*3/uL (ref 1.7–7.7)
Neutrophils Relative %: 64 %
Platelets: 574 10*3/uL — ABNORMAL HIGH (ref 150–400)
RBC: 3.72 MIL/uL — ABNORMAL LOW (ref 4.22–5.81)
RDW: 13.5 % (ref 11.5–15.5)
WBC: 10.4 10*3/uL (ref 4.0–10.5)
nRBC: 0 % (ref 0.0–0.2)

## 2022-05-26 LAB — COMPREHENSIVE METABOLIC PANEL
ALT: 30 U/L (ref 0–44)
AST: 22 U/L (ref 15–41)
Albumin: 3.5 g/dL (ref 3.5–5.0)
Alkaline Phosphatase: 92 U/L (ref 38–126)
Anion gap: 11 (ref 5–15)
BUN: 10 mg/dL (ref 6–20)
CO2: 26 mmol/L (ref 22–32)
Calcium: 9.7 mg/dL (ref 8.9–10.3)
Chloride: 104 mmol/L (ref 98–111)
Creatinine, Ser: 0.91 mg/dL (ref 0.61–1.24)
GFR, Estimated: >60 mL/min (ref >60)
Glucose, Bld: 154 mg/dL — ABNORMAL HIGH (ref 70–99)
Potassium: 4 mmol/L (ref 3.5–5.1)
Sodium: 141 mmol/L (ref 135–145)
Total Bilirubin: 0.4 mg/dL (ref 0.3–1.2)
Total Protein: 6.8 g/dL (ref 6.5–8.1)

## 2022-05-26 LAB — CLOSTRIDIUM DIFFICILE BY PCR: Toxigenic C. Difficile by PCR: NEGATIVE

## 2022-05-26 LAB — LIPASE, BLOOD: Lipase: 58 U/L — ABNORMAL HIGH (ref 11–51)

## 2022-05-26 MED ORDER — ONDANSETRON HCL 4 MG/2ML IJ SOLN
4.0000 mg | Freq: Once | INTRAMUSCULAR | Status: AC
  Administered 2022-05-26: 4 mg via INTRAVENOUS
  Filled 2022-05-26: qty 2

## 2022-05-26 MED ORDER — SODIUM CHLORIDE 0.9 % IV BOLUS
1000.0000 mL | Freq: Once | INTRAVENOUS | Status: AC
  Administered 2022-05-26: 1000 mL via INTRAVENOUS

## 2022-05-26 MED ORDER — LACTATED RINGERS IV BOLUS
1000.0000 mL | Freq: Once | INTRAVENOUS | Status: AC
  Administered 2022-05-26: 1000 mL via INTRAVENOUS

## 2022-05-26 MED ORDER — IOPAMIDOL (ISOVUE-300) INJECTION 61%
100.0000 mL | Freq: Once | INTRAVENOUS | Status: AC | PRN
  Administered 2022-05-26: 100 mL via INTRAVENOUS

## 2022-05-26 MED ORDER — AMOXICILLIN-POT CLAVULANATE 875-125 MG PO TABS: 1.0000 | ORAL_TABLET | Freq: Two times a day (BID) | ORAL | 0 refills | Status: DC

## 2022-05-26 MED ORDER — PIPERACILLIN-TAZOBACTAM 3.375 G IVPB
3.3750 g | Freq: Three times a day (TID) | INTRAVENOUS | Status: DC
  Administered 2022-05-26 – 2022-05-29 (×8): 3.375 g via INTRAVENOUS
  Filled 2022-05-26 (×11): qty 50

## 2022-05-26 MED ORDER — MORPHINE SULFATE (PF) 4 MG/ML IV SOLN
4.0000 mg | Freq: Once | INTRAVENOUS | Status: AC
  Administered 2022-05-26: 4 mg via INTRAVENOUS
  Filled 2022-05-26: qty 1

## 2022-05-26 NOTE — ED Provider Triage Note (Signed)
Emergency Medicine Provider Triage Evaluation Note  Devaughn Savant , a 61 y.o. male  was evaluated in triage.  Pt complains of abdominal pian.  Pt sent here for admission to hospitalist due to post op infection.  Pt suppose to have IR procedure tomorrow   Review of Systems  Positive:  Negative: fever  Physical Exam  BP (!) 144/70 (BP Location: Right Arm)   Pulse 64   Temp 98.6 F (37 C) (Oral)   Resp 14   Ht 5\' 10"  (1.778 m)   Wt 106.6 kg   SpO2 98%   BMI 33.72 kg/m  Gen:   Awake, no distress   Resp:  Normal effort  MSK:   Moves extremities without difficulty  Other:    Medical Decision Making  Medically screening exam initiated at 6:31 PM.  Appropriate orders placed.  Ousmane Seeman was informed that the remainder of the evaluation will be completed by another provider, this initial triage assessment does not replace that evaluation, and the importance of remaining in the ED until their evaluation is complete.     Niel Hummer, Elson Areas 05/26/22 [phone]

## 2022-05-26 NOTE — Telephone Encounter (Signed)
Inbound call from patients wife inquiring about results.  Thank you

## 2022-05-26 NOTE — ED Triage Notes (Signed)
Pt reports gallbladder removal 2 weeks ago; was having abdominal pain and diarrhea since his surgery so he had a CT today and was told to come to the ER due to an abscess where his gallbladder was. Denies fever/chills, reports diarrhea still.

## 2022-05-26 NOTE — Telephone Encounter (Signed)
Spoke with [employer] and pt to go to [address] [employer] [facility] location as a walk in anytime before 3pm. They will give him the contrast to drink at that location. Spoke with wife and she is aware.

## 2022-05-26 NOTE — Telephone Encounter (Signed)
Dr. Rhea Belton to speak with IR and to call pt regarding CT results. Augmentin 875mg  BID for 10 days sent to pharmacy per Dr. .

## 2022-05-26 NOTE — Telephone Encounter (Signed)
I received a call from Dr. Rhea Belton about this patient, as I am on-call tonight covering our service.  The patient has been doing poorly since recent cholecystectomy.  Had a CT scan which shows a large fluid collection in the right upper quadrant, unclear if this may be an abscess or not but it warrants further evaluation urgently given the patient is doing poorly with ongoing pain and poor PO intake.  Dr. Rhea Belton reviewed the CT and had a conversation with Dr. Lowella Dandy of IR about how to manage this.  They recommended the patient to go to the emergency room for admission, needs observation, antibiotics and IR consult to discuss possible drainage and to determine if this is an abscess or not.  Recommend the patient be admitted to hospitalist service, with IR consultation for drainage.  GI services certainly available to help out with this patient during his inpatient course if needed.  The patient has been instructed to go to the hospital already, he is on his way there now.

## 2022-05-26 NOTE — ED Provider Notes (Signed)
[facility] EMERGENCY DEPARTMENT Provider Note   CSN: [id number] Arrival date & time: 05/26/22  1813     History  Chief Complaint  Patient presents with   Post-op Problem    Rui Wordell is a 61 y.o. male.  61 year old male with a past medical history of type 2 diabetes, hypertension, currently 2 weeks s/p laparoscopic cholecystectomy presents to the ED with worsening right upper quadrant abdominal pain, found to have a fluid collection on outpatient CT performed today.  Patient states that he has done poorly since his operation, losing approximately 19 pounds due to poor appetite and persistent nonbloody diarrhea.  He had a follow-up appointment with Dr. Rhea Belton (GI) who ordered a CT abdomen pelvis that was concerning for a fluid collection around his prior chole.  Patient reports significant nausea but denies vomiting, denies fevers.  The history is provided by the patient, medical records and the spouse.       Home Medications Prior to Admission medications   Medication Sig Start Date End Date Taking? Authorizing Provider  amoxicillin-clavulanate (AUGMENTIN) 875-125 MG tablet Take 1 tablet by mouth 2 (two) times daily. 05/15/22   [name], MD  amoxicillin-clavulanate (AUGMENTIN) 875-125 MG tablet Take 1 tablet by mouth 2 (two) times daily. 05/26/22   [name], [name], MD  Ascorbic Acid (VITAMIN C) 1000 MG tablet Take 1,000 mg by mouth daily.    [provider]  aspirin EC 81 MG tablet Take 81 mg by mouth daily.    [provider]  Cholecalciferol (VITAMIN D3) 2000 units capsule Take 2,000 Units by mouth daily.    [provider]  Coenzyme Q10 (COQ10) 100 MG CAPS Take 100 mg by mouth daily.    [provider]  Cyanocobalamin 2000 MCG TBCR Take 2,000 mcg by mouth daily.    [provider]  Evolocumab (REPATHA SURECLICK) 140 MG/ML SOAJ INJECT 140 MG  UNDER THE SKIN EVERY 2 WEEKS AS DIRECTED Patient taking differently:  Inject 140 mg into the muscle every 14 (fourteen) days. 06/04/21   Patwardhan, [name], MD  ezetimibe (ZETIA) 10 MG tablet Take 10 mg by mouth daily.    [provider]  glipiZIDE (GLUCOTROL) 10 MG tablet Take 10 mg by mouth daily before breakfast. 07/17/17   [provider]  JARDIANCE 10 MG TABS tablet Take 10 mg by mouth daily. 07/10/17   [provider]  loratadine (CLARITIN) 10 MG tablet Take 10 mg by mouth daily.    [provider]  losartan (COZAAR) 50 MG tablet Take 1 tablet (50 mg total) by mouth daily. 04/12/22   Patwardhan, [name], MD  metFORMIN (GLUCOPHAGE-XR) 500 MG 24 hr tablet Take 500 mg by mouth 2 (two) times daily. 07/17/17   [provider]  nitroGLYCERIN (NITROSTAT) 0.4 MG SL tablet Place 1 tablet (0.4 mg total) under the tongue every 5 (five) minutes as needed for chest pain. 08/24/17   [name], MD  oxyCODONE (OXY IR/ROXICODONE) 5 MG immediate release tablet Take 1 tablet (5 mg total) by mouth every 6 (six) hours as needed for moderate pain. 05/15/22   [name], MD  pantoprazole (PROTONIX) 40 MG tablet Take 1 tablet (40 mg total) by mouth daily. 05/16/22   [name], [name], MD  pioglitazone (ACTOS) 30 MG tablet Take 30 mg by mouth daily. 07/17/17   [provider]  Semaglutide, 2 MG/DOSE, (OZEMPIC, 2 MG/DOSE,) 8 MG/3ML SOPN Inject 2 mg into the skin once  a week. Sundays    [provider]      Allergies    Lisinopril and Statins    Review of Systems   Review of Systems  Constitutional:  Positive for appetite change and unexpected weight change. Negative for chills and fever.  Gastrointestinal:  Positive for abdominal pain and nausea. Negative for diarrhea.    Physical Exam Updated Vital Signs BP 126/62 (BP Location: Right Arm)   Pulse (!) 56   Temp 98.4 F (36.9 C) (Oral)   Resp 16   Ht 5\' 10"  (1.778 m)   Wt 106.6 kg   SpO2 100%   BMI 33.72 kg/m  Physical Exam Vitals and nursing note reviewed.   Constitutional:      General: He is not in acute distress.    Appearance: Normal appearance. He is well-developed. He is not ill-appearing.  HENT:     Head: Normocephalic.  Cardiovascular:     Rate and Rhythm: Normal rate and regular rhythm.     Heart sounds: No murmur heard. Pulmonary:     Effort: Pulmonary effort is normal. No respiratory distress.     Breath sounds: Normal breath sounds.  Abdominal:     Palpations: Abdomen is soft.     Tenderness: There is abdominal tenderness in the right upper quadrant. There is no guarding.     Comments: Prior laparoscopy incisions are clean, dry, intact.  No surrounding cellulitis or purulent discharge.  Musculoskeletal:        General: No swelling.  Skin:    General: Skin is warm and dry.  Neurological:     Mental Status: He is alert.     ED Results / Procedures / Treatments   Labs (all labs ordered are listed, but only abnormal results are displayed) Labs Reviewed  CBC WITH DIFFERENTIAL/PLATELET - Abnormal; Notable for the following components:      Result Value   RBC 3.72 (*)    Hemoglobin 12.0 (*)    HCT 36.6 (*)    Platelets 574 (*)    Abs Immature Granulocytes 0.18 (*)    All other components within normal limits  COMPREHENSIVE METABOLIC PANEL - Abnormal; Notable for the following components:   Glucose, Bld 154 (*)    All other components within normal limits  LIPASE, BLOOD - Abnormal; Notable for the following components:   Lipase 58 (*)    All other components within normal limits  CULTURE, BLOOD (ROUTINE X 2)  CULTURE, BLOOD (ROUTINE X 2)  URINALYSIS, ROUTINE W REFLEX MICROSCOPIC    EKG None  Radiology CT Abdomen Pelvis W Contrast  Result Date: 05/26/2022 CLINICAL DATA:  61 year old male with history of abdominal pain, nausea and diarrhea. Status post cholecystectomy 2 weeks ago. EXAM: CT ABDOMEN AND PELVIS WITH CONTRAST TECHNIQUE: Multidetector CT imaging of the abdomen and pelvis was performed using the  standard protocol following bolus administration of intravenous contrast. RADIATION DOSE REDUCTION: This exam was performed according to the departmental dose-optimization program which includes automated exposure control, adjustment of the mA and/or kV according to patient size and/or use of iterative reconstruction technique. CONTRAST:  67 ISOVUE-300 IOPAMIDOL (ISOVUE-300) INJECTION 61% COMPARISON:  CT the abdomen and pelvis 05/11/2022. FINDINGS: Lower chest: Linear areas of subsegmental atelectasis in the right lower lobe and inferior segment of the lingula, new compared to the prior study. Hepatobiliary: Postoperative changes of cholecystectomy are noted. In the cholecystectomy bed there is some low-attenuation material with internal gas, potentially indicative of hemostatic material. Surrounding this there  is a 7.5 x 4.8 x 6.2 cm postoperative low-attenuation fluid collection which demonstrates some rim enhancement and has additional more isolated internal locules of gas, concerning for potential abscess, although a biloma or other postoperative fluid collection is not excluded. Remaining portions of the liver are otherwise normal in appearance without additional lesions. No intra or extrahepatic biliary ductal dilatation. Pancreas: No definite pancreatic mass or peripancreatic fluid collections or inflammatory changes. Spleen: Unremarkable. Adrenals/Urinary Tract: 3.4 cm low-attenuation lesion in the posterolateral aspect of the interpolar region of the right kidney is compatible with a simple cyst (Bosniak class 1), no imaging follow-up is recommended. Left kidney and bilateral adrenal glands are normal in appearance. No hydroureteronephrosis. Urinary bladder is normal in appearance. Stomach/Bowel: Normal appearance of the stomach. No pathologic dilatation of small bowel or colon. Numerous colonic diverticulae are noted, particularly in the descending colon and sigmoid colon, without surrounding  inflammatory changes to suggest an acute diverticulitis at this time. The appendix is not confidently identified and may be surgically absent. Regardless, there are no inflammatory changes noted adjacent to the cecum to suggest the presence of an acute appendicitis at this time. Vascular/Lymphatic: Aortic atherosclerosis, without evidence of aneurysm or dissection in the abdominal or pelvic vasculature. No lymphadenopathy noted in the abdomen or pelvis. Reproductive: Prostate gland and seminal vesicles are unremarkable in appearance. Other: No significant volume of ascites.  No pneumoperitoneum. Musculoskeletal: There are no aggressive appearing lytic or blastic lesions noted in the visualized portions of the skeleton. IMPRESSION: 1. Large postoperative fluid collection in the gallbladder fossa adjacent to the site of prior cholecystectomy. This contains both low-attenuation fluid and gas, with peripheral rim enhancement and surrounding inflammatory changes, suspicious for abscess, although biloma or other postoperative fluid collection is not excluded. Electronically Signed   By: Trudie Reed M.D.   On: 05/26/2022 10:58    Procedures Procedures    Medications Ordered in ED Medications  piperacillin-tazobactam (ZOSYN) IVPB 3.375 g (has no administration in time range)  lactated ringers bolus 1,000 mL (has no administration in time range)    ED Course/ Medical Decision Making/ A&P                           Medical Decision Making Amount and/or Complexity of Data Reviewed Independent Historian: spouse External Data Reviewed: notes. Labs: ordered. Radiology: independent interpretation performed.  Risk Prescription drug management. Decision regarding hospitalization.   61 year old male with a past medical history as above presents today with concern for a fluid collection around his prior cholecystectomy.  On arrival, patient is well-appearing, hemodynamically stable, afebrile in the ED.   Abdomen is tender in the right upper quadrant but soft, no guarding.  I reviewed the patient's medical record as well as his CT abdomen pelvis from earlier today.  CT concerning for a large postoperative fluid collection in the gallbladder fossa, containing both fluid and gas and peripheral rim enhancement suspicious for an abscess.  GI had previously spoken with IR and had recommended the patient present to the emergency department for admission for IR consultation and IV antibiotics.  I also reviewed and interpreted the patient's labs.  No significant leukocytosis, though he does have evidence of a left shift.  CMP without significant electrolyte abnormalities, no evidence of renal or liver dysfunction.  Lipase is not suggestive of pancreatitis at this time.  IV fluids and Zosyn ordered in the ED.  I securely chatted with the gastroenterologist on-call and notified  him of the patient's ED arrival.  I then spoke with the inpatient medicine team who is agreeable to accept the patient to their service.  Results and plan of care were discussed with the patient and his wife at bedside who verbalized understanding.  At this time, patient stable for transfer to the floor.   Final Clinical Impression(s) / ED Diagnoses Final diagnoses:  Intra-abdominal fluid collection  S/P laparoscopic cholecystectomy    Rx / DC Orders ED Discharge Orders     None         Ramy Greth, [place], MD 05/26/22 2155    Jacalyn Lefevre, MD 05/26/22 2311

## 2022-05-27 ENCOUNTER — Inpatient Hospital Stay (HOSPITAL_COMMUNITY): Payer: No Typology Code available for payment source

## 2022-05-27 ENCOUNTER — Encounter (HOSPITAL_COMMUNITY): Payer: Self-pay | Admitting: Family Medicine

## 2022-05-27 DIAGNOSIS — E119 Type 2 diabetes mellitus without complications: Secondary | ICD-10-CM | POA: Diagnosis not present

## 2022-05-27 DIAGNOSIS — R188 Other ascites: Secondary | ICD-10-CM | POA: Diagnosis not present

## 2022-05-27 DIAGNOSIS — I2510 Atherosclerotic heart disease of native coronary artery without angina pectoris: Secondary | ICD-10-CM | POA: Diagnosis not present

## 2022-05-27 DIAGNOSIS — I10 Essential (primary) hypertension: Secondary | ICD-10-CM | POA: Diagnosis not present

## 2022-05-27 LAB — CBC
HCT: 32 % — ABNORMAL LOW (ref 39.0–52.0)
Hemoglobin: 10.5 g/dL — ABNORMAL LOW (ref 13.0–17.0)
MCH: 32.3 pg (ref 26.0–34.0)
MCHC: 32.8 g/dL (ref 30.0–36.0)
MCV: 98.5 fL (ref 80.0–100.0)
Platelets: 461 10*3/uL — ABNORMAL HIGH (ref 150–400)
RBC: 3.25 MIL/uL — ABNORMAL LOW (ref 4.22–5.81)
RDW: 13.3 % (ref 11.5–15.5)
WBC: 8 10*3/uL (ref 4.0–10.5)
nRBC: 0 % (ref 0.0–0.2)

## 2022-05-27 LAB — COMPREHENSIVE METABOLIC PANEL
ALT: 24 U/L (ref 0–44)
AST: 19 U/L (ref 15–41)
Albumin: 2.8 g/dL — ABNORMAL LOW (ref 3.5–5.0)
Alkaline Phosphatase: 75 U/L (ref 38–126)
Anion gap: 7 (ref 5–15)
BUN: 8 mg/dL (ref 6–20)
CO2: 25 mmol/L (ref 22–32)
Calcium: 8.7 mg/dL — ABNORMAL LOW (ref 8.9–10.3)
Chloride: 110 mmol/L (ref 98–111)
Creatinine, Ser: 0.75 mg/dL (ref 0.61–1.24)
GFR, Estimated: >60 mL/min (ref >60)
Glucose, Bld: 112 mg/dL — ABNORMAL HIGH (ref 70–99)
Potassium: 3.7 mmol/L (ref 3.5–5.1)
Sodium: 142 mmol/L (ref 135–145)
Total Bilirubin: 0.5 mg/dL (ref 0.3–1.2)
Total Protein: 5.2 g/dL — ABNORMAL LOW (ref 6.5–8.1)

## 2022-05-27 LAB — CBG MONITORING, ED
Glucose-Capillary: 103 mg/dL — ABNORMAL HIGH (ref 70–99)
Glucose-Capillary: 114 mg/dL — ABNORMAL HIGH (ref 70–99)
Glucose-Capillary: 97 mg/dL (ref 70–99)

## 2022-05-27 LAB — HIV ANTIBODY (ROUTINE TESTING W REFLEX): HIV Screen 4th Generation wRfx: NONREACTIVE

## 2022-05-27 LAB — GLUCOSE, CAPILLARY
Glucose-Capillary: 102 mg/dL — ABNORMAL HIGH (ref 70–99)
Glucose-Capillary: 122 mg/dL — ABNORMAL HIGH (ref 70–99)

## 2022-05-27 LAB — PROTIME-INR
INR: 1.1 (ref 0.8–1.2)
Prothrombin Time: 13.6 seconds (ref 11.4–15.2)

## 2022-05-27 MED ORDER — FENTANYL CITRATE (PF) 100 MCG/2ML IJ SOLN
INTRAMUSCULAR | Status: AC
  Filled 2022-05-27: qty 4

## 2022-05-27 MED ORDER — LACTATED RINGERS IV SOLN: INTRAVENOUS | Status: DC

## 2022-05-27 MED ORDER — HYDROMORPHONE HCL 1 MG/ML IJ SOLN
1.0000 mg | INTRAMUSCULAR | Status: DC | PRN
  Administered 2022-05-27 – 2022-05-28 (×5): 1 mg via INTRAVENOUS
  Filled 2022-05-27 (×5): qty 1

## 2022-05-27 MED ORDER — HYDRALAZINE HCL 20 MG/ML IJ SOLN: 10.0000 mg | Freq: Four times a day (QID) | INTRAMUSCULAR | Status: DC | PRN

## 2022-05-27 MED ORDER — LIDOCAINE HCL 1 % IJ SOLN
INTRAMUSCULAR | Status: AC
  Filled 2022-05-27: qty 10

## 2022-05-27 MED ORDER — TECHNETIUM TC 99M MEBROFENIN IV KIT
5.4000 | PACK | Freq: Once | INTRAVENOUS | Status: AC | PRN
  Administered 2022-05-27: 5.4 via INTRAVENOUS

## 2022-05-27 MED ORDER — FENTANYL CITRATE PF 50 MCG/ML IJ SOSY: 25.0000 ug | PREFILLED_SYRINGE | INTRAMUSCULAR | Status: DC | PRN

## 2022-05-27 MED ORDER — INSULIN ASPART 100 UNIT/ML IJ SOLN
0.0000 [IU] | INTRAMUSCULAR | Status: DC
  Administered 2022-05-28 – 2022-05-29 (×2): 1 [IU] via SUBCUTANEOUS

## 2022-05-27 MED ORDER — ACETAMINOPHEN 325 MG PO TABS: 650.0000 mg | ORAL_TABLET | Freq: Four times a day (QID) | ORAL | Status: DC | PRN

## 2022-05-27 MED ORDER — PANTOPRAZOLE SODIUM 40 MG PO TBEC
40.0000 mg | DELAYED_RELEASE_TABLET | Freq: Every day | ORAL | Status: DC
  Administered 2022-05-28: 40 mg via ORAL
  Filled 2022-05-27: qty 1

## 2022-05-27 MED ORDER — MIDAZOLAM HCL 2 MG/2ML IJ SOLN
INTRAMUSCULAR | Status: AC
  Filled 2022-05-27: qty 4

## 2022-05-27 MED ORDER — MIDAZOLAM HCL 2 MG/2ML IJ SOLN
INTRAMUSCULAR | Status: DC | PRN
  Administered 2022-05-27 (×2): 1 mg via INTRAVENOUS

## 2022-05-27 MED ORDER — ACETAMINOPHEN 650 MG RE SUPP: 650.0000 mg | Freq: Four times a day (QID) | RECTAL | Status: DC | PRN

## 2022-05-27 MED ORDER — SODIUM CHLORIDE 0.9% FLUSH
5.0000 mL | Freq: Three times a day (TID) | INTRAVENOUS | Status: DC
  Administered 2022-05-27 – 2022-05-29 (×5): 5 mL

## 2022-05-27 MED ORDER — FENTANYL CITRATE (PF) 100 MCG/2ML IJ SOLN
INTRAMUSCULAR | Status: DC | PRN
  Administered 2022-05-27 (×2): 50 ug via INTRAVENOUS

## 2022-05-27 MED ORDER — CHOLESTYRAMINE 4 G PO PACK
4.0000 g | PACK | Freq: Three times a day (TID) | ORAL | Status: DC
  Administered 2022-05-27 – 2022-05-28 (×3): 4 g via ORAL
  Filled 2022-05-27 (×4): qty 1

## 2022-05-27 NOTE — Consult Note (Signed)
Chief Complaint: Patient was seen in consultation today for RUQ fluid collection s/p laparoscopic cholecystectomy 05/14/22 Chief Complaint  Patient presents with   Post-op Problem    Referring Physician(s): Odie Seraimothy Opyd, MD   Supervising Physician: Malachy MoanMcCullough, Heath  [facility]  History of Present Illness: Niel HummerBert Alas is a 61 y.o. male with PMH significant for CAD, T2DM, HTN, and cholecystitis s/p laparoscopic cholecystectomy 05/14/22 who [facility]15 for RUQ pain with an abnormal outpatient CT. CT revealed a large post-operative fluid collection in the gallbladder fossa. Patient states he has had significant pain in his RUQ since he was discharged home after his surgery, and also reports nausea, diarrhea, and poor appetite. Patient states he has lost 20 pounds since his surgery. He denies fever and chills. IR was consulted early in the morning of 6/16 to evaluate patient for percutaneous drain placement in the gallbladder fossa. Pt has been NPO since lunchtime on 6/15 and has not taken his home medication of aspirin since 6/14.  CT 05/26/22 IMPRESSION: 1. Large postoperative fluid collection in the gallbladder fossa adjacent to the site of prior cholecystectomy. This contains both low-attenuation fluid and gas, with peripheral rim enhancement and surrounding inflammatory changes, suspicious for abscess, although biloma or other postoperative fluid collection is not excluded.   Past Medical History:  Diagnosis Date   Coronary artery disease    Diabetes mellitus without complication (HCC)    Myocardial infarct [facility](HCC)     Past Surgical History:  Procedure Laterality Date   APPENDECTOMY     BACK SURGERY     BIOPSY  05/13/2022   Procedure: BIOPSY;  Surgeon: Beverley FiedlerPyrtle, Jay M, MD;  Location: [facility] ENDOSCOPY;  Service: Gastroenterology;;   CARDIAC CATHETERIZATION     CHOLECYSTECTOMY N/A 05/14/2022   Procedure: LAPAROSCOPIC CHOLECYSTECTOMY;  Surgeon: Rodman PickleKinsinger,  Luke Aaron, MD;  Location: [facility] OR;  Service: General;  Laterality: N/A;   CORONARY ANGIOPLASTY     CORONARY STENT PLACEMENT     ESOPHAGOGASTRODUODENOSCOPY (EGD) WITH PROPOFOL N/A 05/13/2022   Procedure: ESOPHAGOGASTRODUODENOSCOPY (EGD) WITH PROPOFOL;  Surgeon: Beverley FiedlerPyrtle, Jay M, MD;  [facility];  Service: Gastroenterology;  Laterality: N/A;   LEFT HEART CATH AND CORONARY ANGIOGRAPHY N/A 08/24/2017   Procedure: LEFT HEART CATH AND CORONARY ANGIOGRAPHY;  Surgeon: [name], Michael, MD;  [facility];  Service: Cardiovascular;  Laterality: N/A;   SINUS SURGERY WITH INSTATRAK      Allergies: Lisinopril and Statins  Medications: Prior to Admission medications   Medication Sig Start Date End Date Taking? Authorizing Provider  Ascorbic Acid (VITAMIN C) 1000 MG tablet Take 1,000 mg by mouth daily.   Yes [provider]  aspirin EC 81 MG tablet Take 81 mg by mouth daily.   Yes [provider]  Cholecalciferol (VITAMIN D3) 2000 units capsule Take 2,000 Units by mouth daily.   Yes [provider]  Coenzyme Q10 (COQ10) 100 MG CAPS Take 100 mg by mouth daily.   Yes [provider]  Cyanocobalamin 2000 MCG TBCR Take 2,000 mcg by mouth daily.   Yes [provider]  Evolocumab (REPATHA SURECLICK) 140 MG/ML SOAJ INJECT 140 MG  UNDER THE SKIN EVERY 2 WEEKS AS DIRECTED Patient taking differently: Inject 140 mg into the muscle every 14 (fourteen) days. 06/04/21  Yes Patwardhan, Manish J, MD  ezetimibe (ZETIA) 10 MG tablet Take 10 mg by mouth daily.   Yes [provider]  glipiZIDE (GLUCOTROL) 10 MG tablet Take 10  mg by mouth daily before breakfast. 07/17/17  Yes [provider]  JARDIANCE 10 MG TABS tablet Take 10 mg by mouth daily. 07/10/17  Yes [provider]  loratadine (CLARITIN) 10 MG tablet Take 10 mg by mouth daily.   Yes [provider]  losartan (COZAAR) 50 MG tablet Take 1 tablet (50 mg total) by mouth  daily. 04/12/22  Yes Patwardhan, Manish J, MD  metFORMIN (GLUCOPHAGE-XR) 500 MG 24 hr tablet Take 500 mg by mouth 2 (two) times daily. 07/17/17  Yes [provider]  nitroGLYCERIN (NITROSTAT) 0.4 MG SL tablet Place 1 tablet (0.4 mg total) under the tongue every 5 (five) minutes as needed for chest pain. 08/24/17  Yes [name], MD  pioglitazone (ACTOS) 30 MG tablet Take 30 mg by mouth daily. 07/17/17  Yes [provider]  Semaglutide, 2 MG/DOSE, (OZEMPIC, 2 MG/DOSE,) 8 MG/3ML SOPN Inject 2 mg into the skin once a week. Sundays   Yes [provider]  amoxicillin-clavulanate (AUGMENTIN) 875-125 MG tablet Take 1 tablet by mouth 2 (two) times daily. Patient not taking: Reported on 05/27/2022 05/15/22   [name], MD  amoxicillin-clavulanate (AUGMENTIN) 875-125 MG tablet Take 1 tablet by mouth 2 (two) times daily. Patient not taking: Reported on 05/27/2022 05/26/22   [name], [name], MD  oxyCODONE (OXY IR/ROXICODONE) 5 MG immediate release tablet Take 1 tablet (5 mg total) by mouth every 6 (six) hours as needed for moderate pain. 05/15/22   [name], MD  pantoprazole (PROTONIX) 40 MG tablet Take 1 tablet (40 mg total) by mouth daily. Patient not taking: Reported on 05/27/2022 05/16/22   [name], [name], MD     Family History  Problem Relation Age of Onset   Stroke Mother    Macular degeneration Mother    Thyroid cancer Mother    Heart disease Father    Heart disease Brother     Social History   Socioeconomic History   Marital status: Married    Spouse name: Not on file   Number of children: Not on file   Years of education: Not on file   Highest education level: Not on file  Occupational History   Not on file  Tobacco Use   Smoking status: Never   Smokeless tobacco: Never  Vaping Use   Vaping Use: Never used  Substance and Sexual Activity   Alcohol use: No   Drug use: No   Sexual activity: Not on file  Other Topics Concern   Not on file  Social History  Narrative   Not on file   Social Determinants of Health   Financial Resource Strain: Not on file  Food Insecurity: Not on file  Transportation Needs: Not on file  Physical Activity: Not on file  Stress: Not on file  Social Connections: Not on file     Review of Systems: A 12 point ROS discussed and pertinent positives are indicated in the HPI above.  All other systems are negative.  Review of Systems  Constitutional:  Positive for unexpected weight change. Negative for chills and fever.  Respiratory:  Negative for chest tightness and shortness of breath.   Cardiovascular:  Positive for chest pain. Negative for palpitations and leg swelling.       Patient states chest pain is radiating from his abdominal pain  Gastrointestinal:  Positive for abdominal pain, diarrhea and nausea. Negative for vomiting.  Neurological:  Negative for dizziness and headaches.  Psychiatric/Behavioral:  Negative for confusion.  Vital Signs: BP 126/77   Pulse (!) 48   Temp 98.4 F (36.9 C) (Oral)   Resp 11   Ht 5\' 10"  (1.778 m)   Wt 235 lb (106.6 kg)   SpO2 95%   BMI 33.72 kg/m   Physical Exam Vitals reviewed.  Constitutional:      General: He is not in acute distress.    Appearance: He is obese.  HENT:     Head: Normocephalic.     Mouth/Throat:     Mouth: Mucous membranes are moist.  Cardiovascular:     Rate and Rhythm: Regular rhythm. Bradycardia present.     Pulses: Normal pulses.     Heart sounds: Normal heart sounds.     Comments: Pt was bradycardic with HR in the upper 40s. Patient states this is his normal HR Pulmonary:     Effort: Pulmonary effort is normal.     Breath sounds: Normal breath sounds.  Abdominal:     Tenderness: There is abdominal tenderness. There is no guarding or rebound.     Comments: Hypoactive bowel sounds  Musculoskeletal:     Right lower leg: No edema.     Left lower leg: No edema.  Skin:    General: Skin is warm and dry.  Neurological:     Mental  Status: He is alert and oriented to person, place, and time.  Psychiatric:        Behavior: Behavior normal.     Imaging: CT Abdomen Pelvis W Contrast  Result Date: 05/26/2022 CLINICAL DATA:  61 year old male with history of abdominal pain, nausea and diarrhea. Status post cholecystectomy 2 weeks ago. EXAM: CT ABDOMEN AND PELVIS WITH CONTRAST TECHNIQUE: Multidetector CT imaging of the abdomen and pelvis was performed using the standard protocol following bolus administration of intravenous contrast. RADIATION DOSE REDUCTION: This exam was performed according to the departmental dose-optimization program which includes automated exposure control, adjustment of the mA and/or kV according to patient size and/or use of iterative reconstruction technique. CONTRAST:  67 ISOVUE-300 IOPAMIDOL (ISOVUE-300) INJECTION 61% COMPARISON:  CT the abdomen and pelvis 05/11/2022. FINDINGS: Lower chest: Linear areas of subsegmental atelectasis in the right lower lobe and inferior segment of the lingula, new compared to the prior study. Hepatobiliary: Postoperative changes of cholecystectomy are noted. In the cholecystectomy bed there is some low-attenuation material with internal gas, potentially indicative of hemostatic material. Surrounding this there is a 7.5 x 4.8 x 6.2 cm postoperative low-attenuation fluid collection which demonstrates some rim enhancement and has additional more isolated internal locules of gas, concerning for potential abscess, although a biloma or other postoperative fluid collection is not excluded. Remaining portions of the liver are otherwise normal in appearance without additional lesions. No intra or extrahepatic biliary ductal dilatation. Pancreas: No definite pancreatic mass or peripancreatic fluid collections or inflammatory changes. Spleen: Unremarkable. Adrenals/Urinary Tract: 3.4 cm low-attenuation lesion in the posterolateral aspect of the interpolar region of the right kidney is  compatible with a simple cyst (Bosniak class 1), no imaging follow-up is recommended. Left kidney and bilateral adrenal glands are normal in appearance. No hydroureteronephrosis. Urinary bladder is normal in appearance. Stomach/Bowel: Normal appearance of the stomach. No pathologic dilatation of small bowel or colon. Numerous colonic diverticulae are noted, particularly in the descending colon and sigmoid colon, without surrounding inflammatory changes to suggest an acute diverticulitis at this time. The appendix is not confidently identified and may be surgically absent. Regardless, there are no inflammatory changes noted adjacent to the cecum to  suggest the presence of an acute appendicitis at this time. Vascular/Lymphatic: Aortic atherosclerosis, without evidence of aneurysm or dissection in the abdominal or pelvic vasculature. No lymphadenopathy noted in the abdomen or pelvis. Reproductive: Prostate gland and seminal vesicles are unremarkable in appearance. Other: No significant volume of ascites.  No pneumoperitoneum. Musculoskeletal: There are no aggressive appearing lytic or blastic lesions noted in the visualized portions of the skeleton. IMPRESSION: 1. Large postoperative fluid collection in the gallbladder fossa adjacent to the site of prior cholecystectomy. This contains both low-attenuation fluid and gas, with peripheral rim enhancement and surrounding inflammatory changes, suspicious for abscess, although biloma or other postoperative fluid collection is not excluded. Electronically Signed   By: Trudie Reed M.D.   On: 05/26/2022 10:58   US Abdomen Limited RUQ (LIVER/GB)  Result Date: 05/12/2022 CLINICAL DATA:  Epigastric pain EXAM: ULTRASOUND ABDOMEN LIMITED RIGHT UPPER QUADRANT COMPARISON:  CT 05/11/2022 FINDINGS: Gallbladder: 1.0 cm gallstone and sludge within the gallbladder. Wall thickening measuring up to 6 mm. Negative sonographic Murphy's. Common bile duct: Diameter: Normal caliber, 4 mm  Liver: No focal lesion identified. Within normal limits in parenchymal echogenicity. Portal vein is patent on color Doppler imaging with normal direction of blood flow towards the liver. Other: None. IMPRESSION: Cholelithiasis. Sludge within the gallbladder. Mild gallbladder wall thickening without sonographic Murphy sign. Recommend clinical correlation to exclude acute cholecystitis. Electronically Signed   By: Charlett Nose M.D.   On: 05/12/2022 19:08   DG Chest Port 1 View  Result Date: 05/11/2022 CLINICAL DATA:  Upper abdominal pain with vomiting since 1 a.m. EXAM: PORTABLE CHEST 1 VIEW COMPARISON:  08/22/2017 FINDINGS: Numerous leads and wires project over the chest. Midline trachea. Borderline cardiomegaly. Mediastinal contours otherwise within normal limits. No pleural effusion or pneumothorax. Clear lungs. IMPRESSION: No acute cardiopulmonary disease. Electronically Signed   By: Jeronimo Greaves M.D.   On: 05/11/2022 14:55   CT Abdomen Pelvis W Contrast  Result Date: 05/11/2022 CLINICAL DATA:  Acute mid abdominal pain since early this morning, nausea, vomiting. Past history of MI, diabetes mellitus, appendectomy EXAM: CT ABDOMEN AND PELVIS WITH CONTRAST TECHNIQUE: Multidetector CT imaging of the abdomen and pelvis was performed using the standard protocol following bolus administration of intravenous contrast. RADIATION DOSE REDUCTION: This exam was performed according to the departmental dose-optimization program which includes automated exposure control, adjustment of the mA and/or kV according to patient size and/or use of iterative reconstruction technique. CONTRAST:  52mL OMNIPAQUE IOHEXOL 300 MG/ML SOLN IV. No oral contrast. COMPARISON:  None FINDINGS: Lower chest: Minimal dependent atelectasis posterior RIGHT lower lobe. Hepatobiliary: 8 mm calcified gallstone in gallbladder. Gallbladder and liver otherwise normal appearance. Pancreas: Normal appearance Spleen: Normal appearance Adrenals/Urinary  Tract: Adrenal glands normal appearance. RIGHT renal cyst 3.2 cm diameter; no follow-up imaging recommended. Kidneys, ureters, and bladder otherwise normal appearance. Stomach/Bowel: Appendix surgically absent by history. Diverticulosis of descending and sigmoid colon without evidence of diverticulitis. Rectum under distended, unable to exclude wall thickening in this setting. Duodenal diverticulum at cranial aspect of third portion. Remaining bowel loops unremarkable. Stomach normal appearance. Vascular/Lymphatic: Atherosclerotic calcifications aorta and iliac arteries without aneurysm. No adenopathy. Reproductive: Mild prostatic enlargement. Seminal vesicles unremarkable Other: Probable BILATERAL inguinal hernias containing fat. No free air or free fluid. Musculoskeletal: Mild degenerative disc disease changes lower lumbar spine. IMPRESSION: Distal colonic diverticulosis without evidence of diverticulitis. Cholelithiasis. Mild prostatic enlargement. Probable BILATERAL inguinal hernias containing fat. No acute intra-abdominal or intrapelvic process identified. Aortic Atherosclerosis (ICD10-I70.0). Electronically Signed  By: Ulyses Southward M.D.   On: 05/11/2022 14:18    Labs:  CBC: Recent Labs    05/15/22 0251 05/24/22 0759 05/26/22 1837 05/27/22 0415  WBC 13.2* 10.8* 10.4 8.0  HGB 11.6* 12.2* 12.0* 10.5*  HCT 33.3* 36.7* 36.6* 32.0*  PLT 322 554.0* 574* 461*    COAGS: No results for input(s): "INR", "APTT" in the last 8760 hours.  BMP: Recent Labs    05/13/22 0059 05/15/22 0251 05/24/22 0759 05/26/22 1837 05/27/22 0415  NA 133* 132* 139 141 142  K 3.7 3.8 4.0 4.0 3.7  CL 103 101 102 104 110  CO2 24 21* 27 26 25   GLUCOSE 116* 173* 159* 154* 112*  BUN 13 17 9 10 8   CALCIUM 8.6* 8.4* 9.6 9.7 8.7*  CREATININE 0.95 0.90 0.78 0.91 0.75  GFRNONAA >60 >60  --  >60 >60    LIVER FUNCTION TESTS: Recent Labs    05/13/22 0059 05/24/22 0759 05/26/22 1837 05/27/22 0415  BILITOT 0.9 0.3  0.4 0.5  AST 16 20 22 19   ALT 15 26 30 24   ALKPHOS 72 96 92 75  PROT 5.8* 6.8 6.8 5.2*  ALBUMIN 3.2*  3.2* 3.8 3.5 2.8*    TUMOR MARKERS: No results for input(s): "AFPTM", "CEA", "CA199", "CHROMGRNA" in the last 8760 hours.  Assessment and Plan: Cori Henningsen is a pleasant 61 yo male presenting today to IR for a large RUQ fluid collection concerning for possible abscess s/p laparoscopic cholecystectomy 05/14/22. IR was consulted on 6/16 for drain placement. His imaging was reviewed and approved by Dr Niel Hummer for percutaneous drain placement possibly on 05/27/22.   Risks and benefits discussed with the patient including bleeding, infection, damage to adjacent structures, bowel perforation/fistula connection, and sepsis.  All of the patient's questions were answered, patient is agreeable to proceed. Consent signed and in IR suite.    Thank you for this interesting consult.  I greatly enjoyed meeting Edon Hoadley and look forward to participating in their care.  A copy of this report was sent to the requesting provider on this date.  Electronically Signed: 7/16, PA-C 05/27/2022, 8:05 AM   I spent a total of 20 Minutes    in face to face in clinical consultation, greater than 50% of which was counseling/coordinating care for percutaneous drain placement.

## 2022-05-27 NOTE — H&P (Signed)
History and Physical    Benjamin Riley IWL:[id number] DOB: [birth year] DOA: 05/26/2022  PCP: Benjamin Coyer, MD   Patient coming from: Home  Chief Complaint: Abdominal pain, abnormal outpatient CT   HPI: Benjamin Riley is a pleasant 61 y.o. male with medical history significant for CAD, type 2 diabetes mellitus, hypertension, and cholecystitis status post laparoscopic cholecystectomy on 05/14/2022 who now presents with right upper quadrant abdominal pain and abnormal outpatient CT.  Patient reports that he has had persistent pain in the right upper quadrant since returning home from the hospital after his cholecystectomy, has also had nausea, loose stool, and poor appetite, but no fever or chills.  He was evaluated with CT of the abdomen and pelvis today that revealed a large postoperative fluid collection in the gallbladder fossa with gas, rim enhancement, and surrounding inflammatory changes concerning for abscess.  He was directed to the ED for further evaluation and management of this.  ED Course: Upon arrival to the ED, patient is found to be afebrile and saturating well on room air with stable blood pressure.  Chemistry panel is unremarkable and CBC notable for thrombocytosis.  Blood cultures were collected and Zosyn was started in the ED.  Review of Systems:  All other systems reviewed and apart from HPI, are negative.  Past Medical History:  Diagnosis Date   Coronary artery disease    Diabetes mellitus without complication (HCC)    Myocardial infarct [facility])     Past Surgical History:  Procedure Laterality Date   APPENDECTOMY     BACK SURGERY     BIOPSY  05/13/2022   Procedure: BIOPSY;  Surgeon: Beverley Fiedler, MD;  Location: [facility] ENDOSCOPY;  Service: Gastroenterology;;   CARDIAC CATHETERIZATION     CHOLECYSTECTOMY N/A 05/14/2022   Procedure: LAPAROSCOPIC CHOLECYSTECTOMY;  Surgeon: Rodman Pickle, MD;  Location: [facility] OR;  Service: General;  Laterality: N/A;   CORONARY ANGIOPLASTY      CORONARY STENT PLACEMENT     ESOPHAGOGASTRODUODENOSCOPY (EGD) WITH PROPOFOL N/A 05/13/2022   Procedure: ESOPHAGOGASTRODUODENOSCOPY (EGD) WITH PROPOFOL;  Surgeon: Beverley Fiedler, MD;  Location: [facility] ENDOSCOPY;  Service: Gastroenterology;  Laterality: N/A;   LEFT HEART CATH AND CORONARY ANGIOGRAPHY N/A 08/24/2017   Procedure: LEFT HEART CATH AND CORONARY ANGIOGRAPHY;  Surgeon: Tonny Bollman, MD;  Location: [facility] INVASIVE CV LAB;  Service: Cardiovascular;  Laterality: N/A;   SINUS SURGERY WITH INSTATRAK      Social History:   reports that he has never smoked. He has never used smokeless tobacco. He reports that he does not drink alcohol and does not use drugs.  Allergies  Allergen Reactions   Lisinopril Other (See Comments)    unknown   Statins Other (See Comments)    Leg cramps with simvastatin and atorvastatin     Family History  Problem Relation Age of Onset   Stroke Mother    Macular degeneration Mother    Thyroid cancer Mother    Heart disease Father    Heart disease Brother      Prior to Admission medications   Medication Sig Start Date End Date Taking? Authorizing Provider  amoxicillin-clavulanate (AUGMENTIN) 875-125 MG tablet Take 1 tablet by mouth 2 (two) times daily. 05/15/22   [name], MD  amoxicillin-clavulanate (AUGMENTIN) 875-125 MG tablet Take 1 tablet by mouth 2 (two) times daily. 05/26/22   [name], [name], MD  Ascorbic Acid (VITAMIN C) 1000 MG tablet Take 1,000 mg by mouth daily.    [provider]  aspirin EC 81 MG tablet Take 81 mg by mouth daily.    [provider]  Cholecalciferol (VITAMIN D3) 2000 units capsule Take 2,000 Units by mouth daily.    [provider]  Coenzyme Q10 (COQ10) 100 MG CAPS Take 100 mg by mouth daily.    [provider]  Cyanocobalamin 2000 MCG TBCR Take 2,000 mcg by mouth daily.    [provider]  Evolocumab (REPATHA SURECLICK) 140 MG/ML SOAJ INJECT 140 MG  UNDER THE SKIN EVERY 2 WEEKS AS  DIRECTED Patient taking differently: Inject 140 mg into the muscle every 14 (fourteen) days. 06/04/21   Patwardhan, [name], MD  ezetimibe (ZETIA) 10 MG tablet Take 10 mg by mouth daily.    [provider]  glipiZIDE (GLUCOTROL) 10 MG tablet Take 10 mg by mouth daily before breakfast. 07/17/17   [provider]  JARDIANCE 10 MG TABS tablet Take 10 mg by mouth daily. 07/10/17   [provider]  loratadine (CLARITIN) 10 MG tablet Take 10 mg by mouth daily.    [provider]  losartan (COZAAR) 50 MG tablet Take 1 tablet (50 mg total) by mouth daily. 04/12/22   Patwardhan, [name], MD  metFORMIN (GLUCOPHAGE-XR) 500 MG 24 hr tablet Take 500 mg by mouth 2 (two) times daily. 07/17/17   [provider]  nitroGLYCERIN (NITROSTAT) 0.4 MG SL tablet Place 1 tablet (0.4 mg total) under the tongue every 5 (five) minutes as needed for chest pain. 08/24/17   [name], MD  oxyCODONE (OXY IR/ROXICODONE) 5 MG immediate release tablet Take 1 tablet (5 mg total) by mouth every 6 (six) hours as needed for moderate pain. 05/15/22   [name], MD  pantoprazole (PROTONIX) 40 MG tablet Take 1 tablet (40 mg total) by mouth daily. 05/16/22   [name], [name], MD  pioglitazone (ACTOS) 30 MG tablet Take 30 mg by mouth daily. 07/17/17   [provider]  Semaglutide, 2 MG/DOSE, (OZEMPIC, 2 MG/DOSE,) 8 MG/3ML SOPN Inject 2 mg into the skin once a week. Sundays    [provider]    Physical Exam: Vitals:   05/26/22 1822 05/26/22 2039 05/27/22 0030 05/27/22 0335  BP:  126/62 129/76 127/73  Pulse:  (!) 56 (!) 50 62  Resp:  16 15 14   Temp:  98.4 F (36.9 C)    TempSrc:  Oral    SpO2:  100% 96% 94%  Weight: 106.6 kg     Height: 5\' 10"  (1.778 m)       Constitutional: NAD, calm  Eyes: PERTLA, lids and conjunctivae normal ENMT: Mucous membranes are moist. Posterior pharynx clear of any exudate or lesions.   Neck: supple, no masses  Respiratory:  no wheezing, no  crackles. No accessory muscle use.  Cardiovascular: S1 & S2 heard, regular rate and rhythm. No extremity edema. No significant JVD. Abdomen: No distension, soft, tender in RUQ. Bowel sounds active.  Musculoskeletal: no clubbing / cyanosis. No joint deformity upper and lower extremities.   Skin: no significant rashes, lesions, ulcers. Warm, dry, well-perfused. Neurologic: CN 2-12 grossly intact. Moving all extremities. Alert and oriented.  Psychiatric: Very pleasant. Cooperative.    Labs and Imaging on Admission: I have personally reviewed following labs and imaging studies  CBC: Recent Labs  Lab 05/24/22 0759 05/26/22 1837  WBC 10.8* 10.4  NEUTROABS 7.8* 6.7  HGB 12.2* 12.0*  HCT 36.7* 36.6*  MCV 94.9 98.4  PLT 554.0* 574*   Basic Metabolic  Panel: Recent Labs  Lab 05/24/22 0759 05/26/22 1837  NA 139 141  K 4.0 4.0  CL 102 104  CO2 27 26  GLUCOSE 159* 154*  BUN 9 10  CREATININE 0.78 0.91  CALCIUM 9.6 9.7   GFR: Estimated Creatinine Clearance: 105.5 mL/min (by C-G formula based on SCr of 0.91 mg/dL). Liver Function Tests: Recent Labs  Lab 05/24/22 0759 05/26/22 1837  AST 20 22  ALT 26 30  ALKPHOS 96 92  BILITOT 0.3 0.4  PROT 6.8 6.8  ALBUMIN 3.8 3.5   Recent Labs  Lab 05/26/22 1837  LIPASE 58*   No results for input(s): "AMMONIA" in the last 168 hours. Coagulation Profile: No results for input(s): "INR", "PROTIME" in the last 168 hours. Cardiac Enzymes: No results for input(s): "CKTOTAL", "CKMB", "CKMBINDEX", "TROPONINI" in the last 168 hours. BNP (last 3 results) No results for input(s): "PROBNP" in the last 8760 hours. HbA1C: No results for input(s): "HGBA1C" in the last 72 hours. CBG: No results for input(s): "GLUCAP" in the last 168 hours. Lipid Profile: No results for input(s): "CHOL", "HDL", "LDLCALC", "TRIG", "CHOLHDL", "LDLDIRECT" in the last 72 hours. Thyroid Function Tests: No results for input(s): "TSH", "T4TOTAL", "FREET4", "T3FREE",  "THYROIDAB" in the last 72 hours. Anemia Panel: No results for input(s): "VITAMINB12", "FOLATE", "FERRITIN", "TIBC", "IRON", "RETICCTPCT" in the last 72 hours. Urine analysis:    Component Value Date/Time   COLORURINE YELLOW 05/11/2022 1257   APPEARANCEUR CLEAR 05/11/2022 1257   LABSPEC >1.046 (H) 05/11/2022 1257   PHURINE 5.5 05/11/2022 1257   GLUCOSEU >1,000 (A) 05/11/2022 1257   HGBUR NEGATIVE 05/11/2022 1257   BILIRUBINUR NEGATIVE 05/11/2022 1257   KETONESUR 40 (A) 05/11/2022 1257   PROTEINUR TRACE (A) 05/11/2022 1257   NITRITE NEGATIVE 05/11/2022 1257   LEUKOCYTESUR NEGATIVE 05/11/2022 1257   Sepsis Labs: @LABRCNTIP (procalcitonin:4,lacticidven:4) ) Recent Results (from the past 240 hour(s))  Clostridium Difficile by PCR([facility] only )     Status: None   Collection Time: 05/24/22 12:43 PM   Specimen: STOOL   Stool  Result Value Ref Range Status   Toxigenic C. Difficile by PCR Negative Negative Final     Radiological Exams on Admission: CT Abdomen Pelvis W Contrast  Result Date: 05/26/2022 CLINICAL DATA:  61 year old male with history of abdominal pain, nausea and diarrhea. Status post cholecystectomy 2 weeks ago. EXAM: CT ABDOMEN AND PELVIS WITH CONTRAST TECHNIQUE: Multidetector CT imaging of the abdomen and pelvis was performed using the standard protocol following bolus administration of intravenous contrast. RADIATION DOSE REDUCTION: This exam was performed according to the departmental dose-optimization program which includes automated exposure control, adjustment of the mA and/or kV according to patient size and/or use of iterative reconstruction technique. CONTRAST:  67 ISOVUE-300 IOPAMIDOL (ISOVUE-300) INJECTION 61% COMPARISON:  CT the abdomen and pelvis 05/11/2022. FINDINGS: Lower chest: Linear areas of subsegmental atelectasis in the right lower lobe and inferior segment of the lingula, new compared to the prior study. Hepatobiliary: Postoperative changes of  cholecystectomy are noted. In the cholecystectomy bed there is some low-attenuation material with internal gas, potentially indicative of hemostatic material. Surrounding this there is a 7.5 x 4.8 x 6.2 cm postoperative low-attenuation fluid collection which demonstrates some rim enhancement and has additional more isolated internal locules of gas, concerning for potential abscess, although a biloma or other postoperative fluid collection is not excluded. Remaining portions of the liver are otherwise normal in appearance without additional lesions. No intra or extrahepatic biliary ductal dilatation. Pancreas: No definite pancreatic mass or  peripancreatic fluid collections or inflammatory changes. Spleen: Unremarkable. Adrenals/Urinary Tract: 3.4 cm low-attenuation lesion in the posterolateral aspect of the interpolar region of the right kidney is compatible with a simple cyst (Bosniak class 1), no imaging follow-up is recommended. Left kidney and bilateral adrenal glands are normal in appearance. No hydroureteronephrosis. Urinary bladder is normal in appearance. Stomach/Bowel: Normal appearance of the stomach. No pathologic dilatation of small bowel or colon. Numerous colonic diverticulae are noted, particularly in the descending colon and sigmoid colon, without surrounding inflammatory changes to suggest an acute diverticulitis at this time. The appendix is not confidently identified and may be surgically absent. Regardless, there are no inflammatory changes noted adjacent to the cecum to suggest the presence of an acute appendicitis at this time. Vascular/Lymphatic: Aortic atherosclerosis, without evidence of aneurysm or dissection in the abdominal or pelvic vasculature. No lymphadenopathy noted in the abdomen or pelvis. Reproductive: Prostate gland and seminal vesicles are unremarkable in appearance. Other: No significant volume of ascites.  No pneumoperitoneum. Musculoskeletal: There are no aggressive appearing  lytic or blastic lesions noted in the visualized portions of the skeleton. IMPRESSION: 1. Large postoperative fluid collection in the gallbladder fossa adjacent to the site of prior cholecystectomy. This contains both low-attenuation fluid and gas, with peripheral rim enhancement and surrounding inflammatory changes, suspicious for abscess, although biloma or other postoperative fluid collection is not excluded. Electronically Signed   By: Trudie Reed M.D.   On: 05/26/2022 10:58     Assessment/Plan  1. Intraabdominal fluid collection  - Presents with persistent RUQ pain after laparoscopic cholecystectomy on 05/14/22 and had outpatient CT fluid collection, possibly abscess in gallbladder fossa  - He is not septic on admission  - Blood cultures were collected and Zosyn started in ED  - Continue Zosyn, consult IR for drainage   2. CAD  - Hx of LAD PCI ~10 yrs ago  - No anginal complaints    3. HTN  - BP at goal, treat as-needed only for now    4. Type II DM  - Check CBGs and use low-intensity SSI for now    DVT prophylaxis: SCDs  Code Status: Full  Level of Care: Level of care: Med-Surg Family Communication: none present  Disposition Plan:  Patient is from: home  Anticipated d/c is to: Home  Anticipated d/c date is: 6/17 or 05/29/22  Patient currently: Pending IR consultation  Consults called: none  Admission status: Inpatient     Briscoe Deutscher, MD [facility]  05/27/2022, 4:04 AM

## 2022-05-27 NOTE — ED Notes (Signed)
IR called to report successful placement of JP drain in RUQ of abdomen.  28mL of fluid were pulled off and sent to lab.  He was given 2mg  Versed and 100 mcg of fentanyl for procedure.  He is currently pain free and A&O.  PT will be returned to room in next 10-15 min  VS 116/68 100% RA, HR 47, RR 12

## 2022-05-27 NOTE — ED Notes (Signed)
Patient transported to IR 

## 2022-05-27 NOTE — ED Notes (Signed)
Pt returned from nuclear med, rates pain 10/10 in RUQ.

## 2022-05-27 NOTE — Progress Notes (Signed)
PROGRESS NOTE    Benjamin Riley  KGM:[id number] DOB: 03/29/1961 DOA: 05/26/2022 PCP: [name], MD    Brief Narrative:  Benjamin Riley is a 61 years old male with past medical history of CAD, type 2 diabetes, hypertension and cholecystitis status post laparoscopic cholecystectomy on 05/14/2022 presented to the hospital with right upper quadrant pain.  Patient reported that he has had persistent pain in the right upper quadrant since returning home from the hospital after cholecystectomy with some loose stools, poor appetite but no fever or chills.  He was then evaluated with a CT scan and pelvis in the ED which showed a large postoperative fluid collection in the gallbladder fossa with gas, rim enhancement, and surrounding inflammatory changes concerning for abscess.  In the ED blood cultures were collected and patient was empirically started on Zosyn and was admitted to hospital.  IR was consulted for drainage of the fluid.    Assessment and Plan: Principal Problem:   Intra-abdominal fluid collection Active Problems:   Coronary artery disease involving native coronary artery of native heart without angina pectoris   Diabetes mellitus type 2, noninsulin dependent (HCC)   Essential hypertension   Intraabdominal fluid collection  Status post laparoscopic cholecystectomy 05/14/2022.  Outpatient CT scan showed fluid collection.  Patient complains of abdominal pain and discomfort.  Was not septic on presentation.  Blood cultures were collected.  Zosyn was empirically initiated.  IR has been consulted for drainage.  I communicated with general surgery today for consultation, no leukocytosis or fever.   CAD  Patient with history of LAD PCI almost 10 years back.  No active issues at this time   Essential HTN  Patient is on losartan at home.  Currently on hold.  We will add as needed hydralazine.  Type II DM  Continue sliding scale insulin for now.  Patient is on glipizide Jardiance metformin  and Actos at home with semaglutide.  Patient is currently NPO.    DVT prophylaxis: SCDs Start: 05/27/22 0336   Code Status:     Code Status: Full Code  Disposition: Home Status is: Inpatient Remains inpatient appropriate because: Postoperative fluid collection, IR intervention, general surgery consultation   Family Communication: Communicated with the patient at bedside.  Consultants:  Interventional radiology General surgery GI  Procedures:  None yet  Antimicrobials:  Zosyn IV 05/26/2022>  Anti-infectives (From admission, onward)    Start     Dose/Rate Route Frequency Ordered Stop   05/26/22 2200  piperacillin-tazobactam (ZOSYN) IVPB 3.375 g        3.375 g 12.5 mL/hr over 240 Minutes Intravenous Every 8 hours 05/26/22 2119         Subjective: Today, patient was seen and examined at bedside.  States that he has right upper quadrant discomfort and feels like a rock.  Denies any fever chills rigors.  Objective: Vitals:   05/27/22 0920 05/27/22 0925 05/27/22 0930 05/27/22 0935  BP: 118/61 118/64 116/68 114/65  Pulse: (!) 44 (!) 50 (!) 44 (!) 44  Resp: 11 12 11 14   Temp:      TempSrc:      SpO2: 100% 99% 100% 100%  Weight:      Height:        Intake/Output Summary (Last 24 hours) at 05/27/2022 0941 Last data filed at 05/27/2022 0929 Gross per 24 hour  Intake 2050 ml  Output 5 ml  Net 2045 ml   Filed Weights   05/26/22 1822  Weight: 106.6 kg  Physical Examination: Body mass index is 33.72 kg/m.  General: Obese built, not in obvious distress HENT:   No scleral pallor or icterus noted. Oral mucosa is moist.  Chest:  Clear breath sounds.  Diminished breath sounds bilaterally. No crackles or wheezes.  CVS: S1 &S2 heard. No murmur.  Regular rate and rhythm. Abdomen: Soft, right upper quadrant tenderness on palpation with mild guarding, bowel sounds are heard.   Extremities: No cyanosis, clubbing or edema.  Peripheral pulses are palpable. Psych: Alert,  awake and oriented, normal mood CNS:  No cranial nerve deficits.  Power equal in all extremities.   Skin: Warm and dry.  No rashes noted.  Data Reviewed:   CBC: Recent Labs  Lab 05/24/22 0759 05/26/22 1837 05/27/22 0415  WBC 10.8* 10.4 8.0  NEUTROABS 7.8* 6.7  --   HGB 12.2* 12.0* 10.5*  HCT 36.7* 36.6* 32.0*  MCV 94.9 98.4 98.5  PLT 554.0* 574* 461*    Basic Metabolic Panel: Recent Labs  Lab 05/24/22 0759 05/26/22 1837 05/27/22 0415  NA 139 141 142  K 4.0 4.0 3.7  CL 102 104 110  CO2 27 26 25   GLUCOSE 159* 154* 112*  BUN 9 10 8   CREATININE 0.78 0.91 0.75  CALCIUM 9.6 9.7 8.7*    Liver Function Tests: Recent Labs  Lab 05/24/22 0759 05/26/22 1837 05/27/22 0415  AST 20 22 19   ALT 26 30 24   ALKPHOS 96 92 75  BILITOT 0.3 0.4 0.5  PROT 6.8 6.8 5.2*  ALBUMIN 3.8 3.5 2.8*     Radiology Studies: CT Abdomen Pelvis W Contrast  Result Date: 05/26/2022 CLINICAL DATA:  61 year old male with history of abdominal pain, nausea and diarrhea. Status post cholecystectomy 2 weeks ago. EXAM: CT ABDOMEN AND PELVIS WITH CONTRAST TECHNIQUE: Multidetector CT imaging of the abdomen and pelvis was performed using the standard protocol following bolus administration of intravenous contrast. RADIATION DOSE REDUCTION: This exam was performed according to the departmental dose-optimization program which includes automated exposure control, adjustment of the mA and/or kV according to patient size and/or use of iterative reconstruction technique. CONTRAST:  ISOVUE-300 IOPAMIDOL (ISOVUE-300) INJECTION 61% COMPARISON:  CT the abdomen and pelvis 05/11/2022. FINDINGS: Lower chest: Linear areas of subsegmental atelectasis in the right lower lobe and inferior segment of the lingula, new compared to the prior study. Hepatobiliary: Postoperative changes of cholecystectomy are noted. In the cholecystectomy bed there is some low-attenuation material with internal gas, potentially indicative of  hemostatic material. Surrounding this there is a 7.5 x 4.8 x 6.2 cm postoperative low-attenuation fluid collection which demonstrates some rim enhancement and has additional more isolated internal locules of gas, concerning for potential abscess, although a biloma or other postoperative fluid collection is not excluded. Remaining portions of the liver are otherwise normal in appearance without additional lesions. No intra or extrahepatic biliary ductal dilatation. Pancreas: No definite pancreatic mass or peripancreatic fluid collections or inflammatory changes. Spleen: Unremarkable. Adrenals/Urinary Tract: 3.4 cm low-attenuation lesion in the posterolateral aspect of the interpolar region of the right kidney is compatible with a simple cyst (Bosniak class 1), no imaging follow-up is recommended. Left kidney and bilateral adrenal glands are normal in appearance. No hydroureteronephrosis. Urinary bladder is normal in appearance. Stomach/Bowel: Normal appearance of the stomach. No pathologic dilatation of small bowel or colon. Numerous colonic diverticulae are noted, particularly in the descending colon and sigmoid colon, without surrounding inflammatory changes to suggest an acute diverticulitis at this time. The appendix is not confidently identified and  may be surgically absent. Regardless, there are no inflammatory changes noted adjacent to the cecum to suggest the presence of an acute appendicitis at this time. Vascular/Lymphatic: Aortic atherosclerosis, without evidence of aneurysm or dissection in the abdominal or pelvic vasculature. No lymphadenopathy noted in the abdomen or pelvis. Reproductive: Prostate gland and seminal vesicles are unremarkable in appearance. Other: No significant volume of ascites.  No pneumoperitoneum. Musculoskeletal: There are no aggressive appearing lytic or blastic lesions noted in the visualized portions of the skeleton. IMPRESSION: 1. Large postoperative fluid collection in the  gallbladder fossa adjacent to the site of prior cholecystectomy. This contains both low-attenuation fluid and gas, with peripheral rim enhancement and surrounding inflammatory changes, suspicious for abscess, although biloma or other postoperative fluid collection is not excluded. Electronically Signed   By: Trudie Reed M.D.   On: 05/26/2022 10:58      LOS: 1 day    Joycelyn Das, MD [facility] Available via Epic secure chat 7am-7pm After these hours, please refer to coverage provider listed on [email or web address] 05/27/2022, 9:41 AM

## 2022-05-27 NOTE — Progress Notes (Addendum)
Subjective: CC: Patient is status post lap chole for gangrenous cholecystitis by Dr. Sheliah Hatch on 6/3.  Discharged POD 1.  Reports since discharge he has been having constant RUQ pain with fatigue, nausea and diarrhea. No fever or vomiting. Reached out to GI's office and had labs, C. difficile test and CT done. C. Diff neg. WBC 6/13 10.8, LFT's wnl. CT A/P showed large postoperative fluid collection in the gallbladder fossa adjacent to the site of prior cholecystectomy containing both low-attenuation fluid and gas, with peripheral rim enhancement and surrounding inflammatory changes. Directed to ED. WBC wnl, LFT's wnl. Lipase noted to be 58. Was admitted by [facility] overnight and IR just performed Perc Drainage. We were asked to see. Continues to have constant RUQ pain that is ~8/10 with associated nausea.   Objective: Vital signs in last 24 hours: Temp:  [98.4 F (36.9 C)-98.6 F (37 C)] 98.4 F (36.9 C) (06/15 2039) Pulse Rate:  [36-75] 49 (06/16 0940) Resp:  [8-16] 12 (06/16 0940) BP: (105-145)/(61-80) 118/72 (06/16 0940) SpO2:  [90 %-100 %] 99 % (06/16 0940) Weight:  [106.6 kg] 106.6 kg (06/15 1822)    Intake/Output from previous day: 06/15 0701 - 06/16 0700 In: 2050 [IV Piggyback:2050] Out: -  Intake/Output this shift: Total I/O In: -  Out: 5 [Drains:5]  PE: Gen:  Alert, NAD, pleasant Card:  Reg Pulm:  CTAB, no W/R/R, effort normal Abd: Soft, ND, TTP in RUQ, +BS, Incisions with glue intact appears well and are without drainage, bleeding, or signs of infection. IR drain with dark old appearing blood in JP Psych: A&Ox3   Lab Results:  Recent Labs    05/26/22 1837 05/27/22 0415  WBC 10.4 8.0  HGB 12.0* 10.5*  HCT 36.6* 32.0*  PLT 574* 461*   BMET Recent Labs    05/26/22 1837 05/27/22 0415  NA 141 142  K 4.0 3.7  CL 104 110  CO2 26 25  GLUCOSE 154* 112*  BUN 10 8  CREATININE 0.91 0.75  CALCIUM 9.7 8.7*   PT/INR Recent Labs    05/27/22 0802  LABPROT  13.6  INR 1.1   CMP     Component Value Date/Time   NA 142 05/27/2022 0415   K 3.7 05/27/2022 0415   CL 110 05/27/2022 0415   CO2 25 05/27/2022 0415   GLUCOSE 112 (H) 05/27/2022 0415   BUN 8 05/27/2022 0415   CREATININE 0.75 05/27/2022 0415   CALCIUM 8.7 (L) 05/27/2022 0415   PROT 5.2 (L) 05/27/2022 0415   ALBUMIN 2.8 (L) 05/27/2022 0415   AST 19 05/27/2022 0415   ALT 24 05/27/2022 0415   ALKPHOS 75 05/27/2022 0415   BILITOT 0.5 05/27/2022 0415   GFRNONAA >60 05/27/2022 0415   GFRAA >60 08/23/2017 0525   Lipase     Component Value Date/Time   LIPASE 58 (H) 05/26/2022 1837    Studies/Results: CT Abdomen Pelvis W Contrast  Result Date: 05/26/2022 CLINICAL DATA:  61 year old male with history of abdominal pain, nausea and diarrhea. Status post cholecystectomy 2 weeks ago. EXAM: CT ABDOMEN AND PELVIS WITH CONTRAST TECHNIQUE: Multidetector CT imaging of the abdomen and pelvis was performed using the standard protocol following bolus administration of intravenous contrast. RADIATION DOSE REDUCTION: This exam was performed according to the departmental dose-optimization program which includes automated exposure control, adjustment of the mA and/or kV according to patient size and/or use of iterative reconstruction technique. CONTRAST:  ISOVUE-300 IOPAMIDOL (ISOVUE-300) INJECTION 61% COMPARISON:  CT the abdomen and pelvis 05/11/2022. FINDINGS: Lower chest: Linear areas of subsegmental atelectasis in the right lower lobe and inferior segment of the lingula, new compared to the prior study. Hepatobiliary: Postoperative changes of cholecystectomy are noted. In the cholecystectomy bed there is some low-attenuation material with internal gas, potentially indicative of hemostatic material. Surrounding this there is a 7.5 x 4.8 x 6.2 cm postoperative low-attenuation fluid collection which demonstrates some rim enhancement and has additional more isolated internal locules of gas, concerning for  potential abscess, although a biloma or other postoperative fluid collection is not excluded. Remaining portions of the liver are otherwise normal in appearance without additional lesions. No intra or extrahepatic biliary ductal dilatation. Pancreas: No definite pancreatic mass or peripancreatic fluid collections or inflammatory changes. Spleen: Unremarkable. Adrenals/Urinary Tract: 3.4 cm low-attenuation lesion in the posterolateral aspect of the interpolar region of the right kidney is compatible with a simple cyst (Bosniak class 1), no imaging follow-up is recommended. Left kidney and bilateral adrenal glands are normal in appearance. No hydroureteronephrosis. Urinary bladder is normal in appearance. Stomach/Bowel: Normal appearance of the stomach. No pathologic dilatation of small bowel or colon. Numerous colonic diverticulae are noted, particularly in the descending colon and sigmoid colon, without surrounding inflammatory changes to suggest an acute diverticulitis at this time. The appendix is not confidently identified and may be surgically absent. Regardless, there are no inflammatory changes noted adjacent to the cecum to suggest the presence of an acute appendicitis at this time. Vascular/Lymphatic: Aortic atherosclerosis, without evidence of aneurysm or dissection in the abdominal or pelvic vasculature. No lymphadenopathy noted in the abdomen or pelvis. Reproductive: Prostate gland and seminal vesicles are unremarkable in appearance. Other: No significant volume of ascites.  No pneumoperitoneum. Musculoskeletal: There are no aggressive appearing lytic or blastic lesions noted in the visualized portions of the skeleton. IMPRESSION: 1. Large postoperative fluid collection in the gallbladder fossa adjacent to the site of prior cholecystectomy. This contains both low-attenuation fluid and gas, with peripheral rim enhancement and surrounding inflammatory changes, suspicious for abscess, although biloma or  other postoperative fluid collection is not excluded. Electronically Signed   By: Trudie Reed M.D.   On: 05/26/2022 10:58    Anti-infectives: Anti-infectives (From admission, onward)    Start     Dose/Rate Route Frequency Ordered Stop   05/26/22 2200  piperacillin-tazobactam (ZOSYN) IVPB 3.375 g        3.375 g 12.5 mL/hr over 240 Minutes Intravenous Every 8 hours 05/26/22 2119          Assessment/Plan GB fossa fluid collection Hx Laparoscopic Cholecystectomy on 6/3 by Dr. Sheliah Hatch for Gangrenous Cholecystitis. Path with Acute Cholecystitis and Cholelithiasis  -S/p IR perc drain on 6/16 with 33mL of bilious output aspirated. Cx's pending. Given aspirated fluid appeared bilious, will obtain HIDA to eval for possible bile leak - Cont abx. Await cx - Trend labs - We will follow with you  FEN - NPO for HIDA VTE - SCDs, okay for chemical prophylaxis from a general surgery standpoint ID - Zosyn 6/15 >>   CAD HTN HLD DM   LOS: 1 day    Jacinto Halim , [facility] 05/27/2022, 10:28 AM Please see Amion for pager number during day hours 7:00am-4:30pm

## 2022-05-27 NOTE — ED Notes (Signed)
Pt transported to Nuclear Med 

## 2022-05-27 NOTE — Procedures (Signed)
Vascular and Interventional Radiology Procedure Note  Patient: Praneeth Bussey DOB: 06-May-1961 Medical Record Number: [id number] Note Date/Time: 05/27/22 9:51 AM   Performing Physician: Roanna Banning, MD Assistant(s): None  Diagnosis: GB fossa collection  Procedure: DRAINAGE CATHETER PLACEMENT into GB FOSSA COLLECTION  Anesthesia: Conscious Sedation Complications: None Estimated Blood Loss: Minimal Specimens: Sent for Gram Stain, Aerobe Culture, and Anerobe Culture  Findings:  Successful CT-guided placement of 12 F catheter into GB fossa collection. 5 mL of bilious output aspirated was submitted for GS / Cx.  Plan:  - Flush drain with 5 mL Normal Saline every 8 hours. - Follow up drain evaluation in 2 week(s).  See detailed procedure note with images in PACS. The patient tolerated the procedure well without incident or complication and was returned to Recovery in stable condition.    Roanna Banning, MD [facility] Inter[facility] Radiology   Pager. [phone] Clinic. [phone]

## 2022-05-28 DIAGNOSIS — I2510 Atherosclerotic heart disease of native coronary artery without angina pectoris: Secondary | ICD-10-CM | POA: Diagnosis not present

## 2022-05-28 DIAGNOSIS — R188 Other ascites: Secondary | ICD-10-CM | POA: Diagnosis not present

## 2022-05-28 DIAGNOSIS — E119 Type 2 diabetes mellitus without complications: Secondary | ICD-10-CM | POA: Diagnosis not present

## 2022-05-28 DIAGNOSIS — I10 Essential (primary) hypertension: Secondary | ICD-10-CM | POA: Diagnosis not present

## 2022-05-28 LAB — COMPREHENSIVE METABOLIC PANEL
ALT: 23 U/L (ref 0–44)
AST: 17 U/L (ref 15–41)
Albumin: 2.7 g/dL — ABNORMAL LOW (ref 3.5–5.0)
Alkaline Phosphatase: 79 U/L (ref 38–126)
Anion gap: 7 (ref 5–15)
BUN: 7 mg/dL (ref 6–20)
CO2: 25 mmol/L (ref 22–32)
Calcium: 8.5 mg/dL — ABNORMAL LOW (ref 8.9–10.3)
Chloride: 103 mmol/L (ref 98–111)
Creatinine, Ser: 0.85 mg/dL (ref 0.61–1.24)
GFR, Estimated: >60 mL/min (ref >60)
Glucose, Bld: 138 mg/dL — ABNORMAL HIGH (ref 70–99)
Potassium: 3.9 mmol/L (ref 3.5–5.1)
Sodium: 135 mmol/L (ref 135–145)
Total Bilirubin: 0.4 mg/dL (ref 0.3–1.2)
Total Protein: 5.4 g/dL — ABNORMAL LOW (ref 6.5–8.1)

## 2022-05-28 LAB — CBC
HCT: 31.7 % — ABNORMAL LOW (ref 39.0–52.0)
Hemoglobin: 10.3 g/dL — ABNORMAL LOW (ref 13.0–17.0)
MCH: 31.6 pg (ref 26.0–34.0)
MCHC: 32.5 g/dL (ref 30.0–36.0)
MCV: 97.2 fL (ref 80.0–100.0)
Platelets: 461 10*3/uL — ABNORMAL HIGH (ref 150–400)
RBC: 3.26 MIL/uL — ABNORMAL LOW (ref 4.22–5.81)
RDW: 13.2 % (ref 11.5–15.5)
WBC: 10.2 10*3/uL (ref 4.0–10.5)
nRBC: 0 % (ref 0.0–0.2)

## 2022-05-28 LAB — LIPASE, BLOOD: Lipase: 34 U/L (ref 11–51)

## 2022-05-28 LAB — GLUCOSE, CAPILLARY
Glucose-Capillary: 103 mg/dL — ABNORMAL HIGH (ref 70–99)
Glucose-Capillary: 111 mg/dL — ABNORMAL HIGH (ref 70–99)
Glucose-Capillary: 142 mg/dL — ABNORMAL HIGH (ref 70–99)
Glucose-Capillary: 151 mg/dL — ABNORMAL HIGH (ref 70–99)
Glucose-Capillary: 153 mg/dL — ABNORMAL HIGH (ref 70–99)
Glucose-Capillary: 175 mg/dL — ABNORMAL HIGH (ref 70–99)
Glucose-Capillary: 195 mg/dL — ABNORMAL HIGH (ref 70–99)

## 2022-05-28 LAB — MAGNESIUM: Magnesium: 1.8 mg/dL (ref 1.7–2.4)

## 2022-05-28 MED ORDER — SODIUM CHLORIDE 0.9% FLUSH
10.0000 mL | Freq: Every day | INTRAVENOUS | 0 refills | Status: AC
  Filled 2022-05-28: qty 300, 30d supply, fill #0

## 2022-05-28 MED ORDER — ONDANSETRON HCL 4 MG/2ML IJ SOLN
4.0000 mg | Freq: Four times a day (QID) | INTRAMUSCULAR | Status: DC | PRN
  Administered 2022-05-28: 4 mg via INTRAVENOUS
  Filled 2022-05-28: qty 2

## 2022-05-28 MED ORDER — TRAMADOL HCL 50 MG PO TABS
50.0000 mg | ORAL_TABLET | Freq: Four times a day (QID) | ORAL | Status: DC | PRN
  Administered 2022-05-28: 50 mg via ORAL
  Filled 2022-05-28: qty 1

## 2022-05-28 NOTE — Progress Notes (Signed)
Supervising Physician: Marliss Coots  Patient Status:  [facility] - In-pt  Chief Complaint:  1 day s/p gallbladder fossa drain  Subjective:  Feeling better.  In good spirits.  Still with some RUQ pain.   Allergies: Lisinopril and Statins  Medications: Prior to Admission medications   Medication Sig Start Date End Date Taking? Authorizing Provider  Ascorbic Acid (VITAMIN C) 1000 MG tablet Take 1,000 mg by mouth daily.   Yes [provider]  aspirin EC 81 MG tablet Take 81 mg by mouth daily.   Yes [provider]  Cholecalciferol (VITAMIN D3) 2000 units capsule Take 2,000 Units by mouth daily.   Yes [provider]  Coenzyme Q10 (COQ10) 100 MG CAPS Take 100 mg by mouth daily.   Yes [provider]  Cyanocobalamin 2000 MCG TBCR Take 2,000 mcg by mouth daily.   Yes [provider]  Evolocumab (REPATHA SURECLICK) 140 MG/ML SOAJ INJECT 140 MG  UNDER THE SKIN EVERY 2 WEEKS AS DIRECTED Patient taking differently: Inject 140 mg into the muscle every 14 (fourteen) days. 06/04/21  Yes Patwardhan, Manish J, MD  ezetimibe (ZETIA) 10 MG tablet Take 10 mg by mouth daily.   Yes [provider]  glipiZIDE (GLUCOTROL) 10 MG tablet Take 10 mg by mouth daily before breakfast. 07/17/17  Yes [provider]  JARDIANCE 10 MG TABS tablet Take 10 mg by mouth daily. 07/10/17  Yes [provider]  loratadine (CLARITIN) 10 MG tablet Take 10 mg by mouth daily.   Yes [provider]  losartan (COZAAR) 50 MG tablet Take 1 tablet (50 mg total) by mouth daily. 04/12/22  Yes Patwardhan, Manish J, MD  metFORMIN (GLUCOPHAGE-XR) 500 MG 24 hr tablet Take 500 mg by mouth 2 (two) times daily. 07/17/17  Yes [provider]  nitroGLYCERIN (NITROSTAT) 0.4 MG SL tablet Place 1 tablet (0.4 mg total) under the tongue every 5 (five) minutes as needed for chest pain. 08/24/17  Yes [name], MD  pioglitazone (ACTOS) 30 MG tablet Take 30 mg by  mouth daily. 07/17/17  Yes [provider]  Semaglutide, 2 MG/DOSE, (OZEMPIC, 2 MG/DOSE,) 8 MG/3ML SOPN Inject 2 mg into the skin once a week. Sundays   Yes [provider]  sodium chloride flush (NS) 0.9 % SOLN 10 mLs by Intracatheter route daily for 30 doses. Inject 5-10cc saline into drain catheter daily. 05/28/22 06/27/22 Yes Mordecai Tindol, PA  amoxicillin-clavulanate (AUGMENTIN) 875-125 MG tablet Take 1 tablet by mouth 2 (two) times daily. Patient not taking: Reported on 05/27/2022 05/15/22   [name], MD  amoxicillin-clavulanate (AUGMENTIN) 875-125 MG tablet Take 1 tablet by mouth 2 (two) times daily. Patient not taking: Reported on 05/27/2022 05/26/22   [name], [name], MD  oxyCODONE (OXY IR/ROXICODONE) 5 MG immediate release tablet Take 1 tablet (5 mg total) by mouth every 6 (six) hours as needed for moderate pain. 05/15/22   [name], MD  pantoprazole (PROTONIX) 40 MG tablet Take 1 tablet (40 mg total) by mouth daily. Patient not taking: Reported on 05/27/2022 05/16/22   Beverley Fiedler, MD     Vital Signs: BP 123/64 (BP Location: Left Arm)   Pulse (!) 49   Temp 98.1 F (36.7 C) (Oral)   Resp 14   Ht 5\' 10"  (1.778 m)   Wt 235 lb (106.6 kg)   SpO2 96%   BMI 33.72 kg/m   Physical Exam Vitals reviewed.  Constitutional:  General: He is not in acute distress.    Appearance: He is not toxic-appearing.  HENT:     Mouth/Throat:     Pharynx: Oropharynx is clear.  Eyes:     Extraocular Movements: Extraocular movements intact.  Cardiovascular:     Rate and Rhythm: Regular rhythm. Bradycardia present.  Pulmonary:     Effort: Pulmonary effort is normal. No respiratory distress.  Abdominal:     Palpations: Abdomen is soft.  Skin:    General: Skin is warm and dry.  Neurological:     General: No focal deficit present.     Mental Status: He is alert and oriented to person, place, and time.  Psychiatric:        Mood and Affect: Mood normal.        Behavior:  Behavior normal.    Drain Location: RUQ Size: Fr size: 12 Fr Date of placement: 05/28/22  Currently to: Drain collection device: suction bulb 24 hour output:  Output by Drain (mL) 05/26/22 0700 - 05/26/22 1459 05/26/22 1500 - 05/26/22 2259 05/26/22 2300 - 05/27/22 0659 05/27/22 0700 - 05/27/22 1459 05/27/22 1500 - 05/27/22 2259 05/27/22 2300 - 05/28/22 0659 05/28/22 0700 - 05/28/22 1214  Closed System Drain 1 Right;Anterior;Superior RUQ Bulb (JP) 12 Fr.    5 10 25      Current examination: Flushes/aspirates easily.  Insertion site unremarkable. Suture in place. Dressed appropriately.   Imaging: CT IMAGE GUIDED DRAINAGE BY PERCUTANEOUS CATHETER  Result Date: 05/27/2022 INDICATION: Recent cholecystectomy with gallbladder fossa collection. EXAM: CT GUIDED DRAINAGE CATHETER PLACEMENT INTO GALLBLADDER FOSSA COLLECTION RADIATION DOSE REDUCTION: This exam was performed according to the departmental dose-optimization program which includes automated exposure control, adjustment of the mA and/or kV according to patient size and/or use of iterative reconstruction technique. COMPARISON:  CT AP, 05/26/2022 and 05/11/2022. MEDICATIONS: The patient is currently admitted to the hospital and receiving intravenous antibiotics. The antibiotics were administered within an appropriate time frame prior to the initiation of the procedure. ANESTHESIA/SEDATION: Moderate (conscious) sedation was employed during this procedure. A total of Versed 2 mg and Fentanyl 100 mcg was administered intravenously. Moderate Sedation Time: 22 minutes. The patient's level of consciousness and vital signs were monitored continuously by radiology nursing throughout the procedure under my direct supervision. CONTRAST:  None COMPLICATIONS: None immediate. PROCEDURE: Informed written consent was obtained from the patient and/or patient's representative after a discussion of the risks, benefits and alternatives to treatment. The patient was  placed supine on the CT gantry and a pre procedural CT was performed re-demonstrating the known abscess/fluid collection within the gallbladder fossa. The procedure was planned. A timeout was performed prior to the initiation of the procedure. The RIGHT upper quadrant was prepped and draped in the usual sterile fashion. The overlying soft tissues were anesthetized with 1% lidocaine with epinephrine. Appropriate trajectory was planned with the use of a 22 gauge spinal needle. An 18 gauge trocar needle was advanced into the abscess/fluid collection and a short Amplatz super stiff wire was coiled within the collection. Appropriate positioning was confirmed with a limited CT scan. The tract was serially dilated allowing placement of a 12 05/13/2022 all-purpose drainage catheter. Appropriate positioning was confirmed with a limited postprocedural CT scan. 5 mL of bilious fluid was aspirated and submitted for analysis. The tube was connected to bulb suction and sutured in place. A dressing was placed. The patient tolerated the procedure well without immediate post procedural complication. IMPRESSION: Successful CT guided placement of a 12 Fr drain  catheter into the gallbladder fossa with aspiration of bilious fluid, as above. Samples were sent to the laboratory as requested by the ordering clinical team. Roanna Banning, MD Vascular and Interventional Radiology Specialists [facility] Radiology Electronically Signed   By: Roanna Banning M.D.   On: 05/27/2022 18:02   NM HEPATOBILIARY LEAK (POST-SURGICAL)  Result Date: 05/27/2022 CLINICAL DATA:  Two weeks status post cholecystectomy. Fluid collection in gallbladder fossa. Evaluate for bile leak. EXAM: NUCLEAR MEDICINE HEPATOBILIARY IMAGING TECHNIQUE: Sequential images of the abdomen were obtained out to 60 minutes following intravenous administration of radiopharmaceutical. RADIOPHARMACEUTICALS:  5.4 mCi Tc-16m  Choletec IV COMPARISON:  None Available. FINDINGS: Prompt uptake and  biliary excretion of activity by the liver is seen. No gallbladder activity seen, consistent with prior cholecystectomy. Biliary activity passes into small bowel, consistent with patent common bile duct. No evidence of leak of biliary activity. IMPRESSION: No evidence of postop bile leak. Prior cholecystectomy.  No evidence of biliary obstruction. Electronically Signed   By: Danae Orleans M.D.   On: 05/27/2022 13:58   CT Abdomen Pelvis W Contrast  Result Date: 05/26/2022 CLINICAL DATA:  61 year old male with history of abdominal pain, nausea and diarrhea. Status post cholecystectomy 2 weeks ago. EXAM: CT ABDOMEN AND PELVIS WITH CONTRAST TECHNIQUE: Multidetector CT imaging of the abdomen and pelvis was performed using the standard protocol following bolus administration of intravenous contrast. RADIATION DOSE REDUCTION: This exam was performed according to the departmental dose-optimization program which includes automated exposure control, adjustment of the mA and/or kV according to patient size and/or use of iterative reconstruction technique. CONTRAST:  ISOVUE-300 IOPAMIDOL (ISOVUE-300) INJECTION 61% COMPARISON:  CT the abdomen and pelvis 05/11/2022. FINDINGS: Lower chest: Linear areas of subsegmental atelectasis in the right lower lobe and inferior segment of the lingula, new compared to the prior study. Hepatobiliary: Postoperative changes of cholecystectomy are noted. In the cholecystectomy bed there is some low-attenuation material with internal gas, potentially indicative of hemostatic material. Surrounding this there is a 7.5 x 4.8 x 6.2 cm postoperative low-attenuation fluid collection which demonstrates some rim enhancement and has additional more isolated internal locules of gas, concerning for potential abscess, although a biloma or other postoperative fluid collection is not excluded. Remaining portions of the liver are otherwise normal in appearance without additional lesions. No intra or  extrahepatic biliary ductal dilatation. Pancreas: No definite pancreatic mass or peripancreatic fluid collections or inflammatory changes. Spleen: Unremarkable. Adrenals/Urinary Tract: 3.4 cm low-attenuation lesion in the posterolateral aspect of the interpolar region of the right kidney is compatible with a simple cyst (Bosniak class 1), no imaging follow-up is recommended. Left kidney and bilateral adrenal glands are normal in appearance. No hydroureteronephrosis. Urinary bladder is normal in appearance. Stomach/Bowel: Normal appearance of the stomach. No pathologic dilatation of small bowel or colon. Numerous colonic diverticulae are noted, particularly in the descending colon and sigmoid colon, without surrounding inflammatory changes to suggest an acute diverticulitis at this time. The appendix is not confidently identified and may be surgically absent. Regardless, there are no inflammatory changes noted adjacent to the cecum to suggest the presence of an acute appendicitis at this time. Vascular/Lymphatic: Aortic atherosclerosis, without evidence of aneurysm or dissection in the abdominal or pelvic vasculature. No lymphadenopathy noted in the abdomen or pelvis. Reproductive: Prostate gland and seminal vesicles are unremarkable in appearance. Other: No significant volume of ascites.  No pneumoperitoneum. Musculoskeletal: There are no aggressive appearing lytic or blastic lesions noted in the visualized portions of the skeleton. IMPRESSION: 1.  Large postoperative fluid collection in the gallbladder fossa adjacent to the site of prior cholecystectomy. This contains both low-attenuation fluid and gas, with peripheral rim enhancement and surrounding inflammatory changes, suspicious for abscess, although biloma or other postoperative fluid collection is not excluded. Electronically Signed   By: Trudie Reed M.D.   On: 05/26/2022 10:58    Labs:  CBC: Recent Labs    05/24/22 0759 05/26/22 1837  05/27/22 0415 05/28/22 0224  WBC 10.8* 10.4 8.0 10.2  HGB 12.2* 12.0* 10.5* 10.3*  HCT 36.7* 36.6* 32.0* 31.7*  PLT 554.0* 574* 461* 461*    COAGS: Recent Labs    05/27/22 0802  INR 1.1    BMP: Recent Labs    05/15/22 0251 05/24/22 0759 05/26/22 1837 05/27/22 0415 05/28/22 0224  NA 132* 139 141 142 135  K 3.8 4.0 4.0 3.7 3.9  CL 101 102 104 110 103  CO2 21* 27 26 25 25   GLUCOSE 173* 159* 154* 112* 138*  BUN 17 9 10 8 7   CALCIUM 8.4* 9.6 9.7 8.7* 8.5*  CREATININE 0.90 0.78 0.91 0.75 0.85  GFRNONAA >60  --  >60 >60 >60    LIVER FUNCTION TESTS: Recent Labs    05/24/22 0759 05/26/22 1837 05/27/22 0415 05/28/22 0224  BILITOT 0.3 0.4 0.5 0.4  AST 20 22 19 17   ALT 26 30 24 23   ALKPHOS 96 92 75 79  PROT 6.8 6.8 5.2* 5.4*  ALBUMIN 3.8 3.5 2.8* 2.7*    Assessment and Plan:  Gallbladder Fluid Collection Now 1 day s/p drain placement 15cc serosanguinous OP in last 24  Continue TID flushes with 5 cc NS. Record output Q shift. Dressing changes QD or PRN if soiled.   Call IR APP or on call IR MD if difficulty flushing or sudden change in drain output.  Repeat imaging/possible drain injection once output < 10 mL/QD (excluding flush material.)  Discharge planning: Outpatient and flushing orders are in place. Patient will follow up with IR clinic 10-14 days post d/c for repeat imaging/possible drain injection. IR scheduler will contact patient with date/time of appointment. Patient will need to flush drain QD with 5 cc NS, record output QD, dressing changes every 2-3 days or earlier if soiled.   IR will continue to follow - please call with questions or concerns.  Electronically Signed: 05/29/22, PA 05/28/2022, 12:11 PM   I spent a total of 25 Minutes at the the patient's bedside AND on the patient's hospital floor or unit, greater than 50% of which was counseling/coordinating care for drain management.

## 2022-05-28 NOTE — Progress Notes (Addendum)
Bili PROGRESS NOTE    Benjamin Riley  WIO:[id number] DOB: [birth year] DOA: 05/26/2022 PCP: [name], MD    Brief Narrative:   Benjamin Riley is a 61 years old male with past medical history of CAD, type 2 diabetes, hypertension and cholecystitis status post laparoscopic cholecystectomy on 05/14/2022 presented to the hospital with right upper quadrant pain.  Patient reported that he has had persistent pain in the right upper quadrant since returning home from the hospital after cholecystectomy with some loose stools, poor appetite but no fever or chills.  He was then evaluated with a CT scan and pelvis in the ED which showed a large postoperative fluid collection in the gallbladder fossa with gas, rim enhancement, and surrounding inflammatory changes concerning for abscess.  In the ED, blood cultures were collected and patient was empirically started on Zosyn and was admitted to hospital.   During hospitalization, IR was consulted and patient underwent treatment biliary drain..   Assessment and Plan: Principal Problem:   Intra-abdominal fluid collection Active Problems:   Coronary artery disease involving native coronary artery of native heart without angina pectoris   Diabetes mellitus type 2, noninsulin dependent (HCC)   Essential hypertension   Intraabdominal fluid collection  Status post laparoscopic cholecystectomy 05/14/2022.  Outpatient CT scan showed fluid collection.  No sepsis.  Blood cultures negative in 2 days.  On IV Zosyn.  IR was consulted and patient underwent biliary drain placement on 05/27/2022.  HIDA scan was negative for biliary leakage.  Fluid culture have been sent, cultures negative so far.  Patient feels a little better today.  No leukocytosis or fever.  We will follow general surgery recommendations.  General surgery has seen the patient today.  Hold cholestyramine for today.  CAD  Patient with history of LAD PCI almost 10 years back.  No active issues at this time    Essential HTN  Patient is on losartan at home.  Currently on hold.  We will add as needed hydralazine.  Type II DM  Continue sliding scale insulin for now.  On diabetic diet.  Patient is on glipizide, Jardiance, metformin and Actos at home with semaglutide.   DVT prophylaxis: SCDs Start: 05/27/22 0336   Code Status:     Code Status: Full Code  Disposition: Home likely tomorrow if okay with surgery.  Status is: Inpatient  Remains inpatient appropriate because: Postoperative fluid collection, status post IR intervention, IV antibiotic   Family Communication:  Communicated with the patient at bedside.  Also spoke with the patient's wife at bedside  Consultants:  Interventional radiology General surgery GI  Procedures:  IR guided biliary drain  placement on 05/27/2022  Antimicrobials:  Zosyn IV 05/26/2022>  Anti-infectives (From admission, onward)    Start     Dose/Rate Route Frequency Ordered Stop   05/26/22 2200  piperacillin-tazobactam (ZOSYN) IVPB 3.375 g        3.375 g 12.5 mL/hr over 240 Minutes Intravenous Every 8 hours 05/26/22 2119         Subjective: Today, patient was seen and examined at bedside.  Feels little better than yesterday in terms of abdominal discomfort.  Has not had diarrhea today.  Patient's spouse at bedside.  Objective: Vitals:   05/27/22 1946 05/28/22 0000 05/28/22 0310 05/28/22 0822  BP: (!) 147/76 111/63 120/62 123/64  Pulse: (!) 54 60 (!) 55 (!) 49  Resp: 14 14 12 14   Temp: 98 F (36.7 C) 98.3 F (36.8 C) 98.6 F (37 C) 98.1 F (  36.7 C)  TempSrc: Oral Oral Oral Oral  SpO2: 92% 93% 91% 96%  Weight:      Height:        Intake/Output Summary (Last 24 hours) at 05/28/2022 1018 Last data filed at 05/28/2022 0830 Gross per 24 hour  Intake 2080.85 ml  Output 1135 ml  Net 945.85 ml    Filed Weights   05/26/22 1822  Weight: 106.6 kg    Physical Examination: Body mass index is 33.72 kg/m.   General: Obese built, not in  obvious distress HENT:   No scleral pallor or icterus noted. Oral mucosa is moist.  Chest:  Clear breath sounds.  Diminished breath sounds bilaterally. No crackles or wheezes.  CVS: S1 &S2 heard. No murmur.  Regular rate and rhythm. Abdomen: Soft, nontender, nondistended.  Bowel sounds are heard.  Right upper quadrant tenderness on palpation with mild guarding.  Biliary drain in place. Extremities: No cyanosis, clubbing or edema.  Peripheral pulses are palpable. Psych: Alert, awake and oriented, normal mood CNS:  No cranial nerve deficits.  Moves all extremities. Skin: Warm and dry.  No rashes noted.  Data Reviewed:   CBC: Recent Labs  Lab 05/24/22 0759 05/26/22 1837 05/27/22 0415 05/28/22 0224  WBC 10.8* 10.4 8.0 10.2  NEUTROABS 7.8* 6.7  --   --   HGB 12.2* 12.0* 10.5* 10.3*  HCT 36.7* 36.6* 32.0* 31.7*  MCV 94.9 98.4 98.5 97.2  PLT 554.0* 574* 461* 461*     Basic Metabolic Panel: Recent Labs  Lab 05/24/22 0759 05/26/22 1837 05/27/22 0415 05/28/22 0224  NA 139 141 142 135  K 4.0 4.0 3.7 3.9  CL 102 104 110 103  CO2 27 26 25 25   GLUCOSE 159* 154* 112* 138*  BUN 9 10 8 7   CREATININE 0.78 0.91 0.75 0.85  CALCIUM 9.6 9.7 8.7* 8.5*  MG  --   --   --  1.8     Liver Function Tests: Recent Labs  Lab 05/24/22 0759 05/26/22 1837 05/27/22 0415 05/28/22 0224  AST 20 22 19 17   ALT 26 30 24 23   ALKPHOS 96 92 75 79  BILITOT 0.3 0.4 0.5 0.4  PROT 6.8 6.8 5.2* 5.4*  ALBUMIN 3.8 3.5 2.8* 2.7*      Radiology Studies: CT IMAGE GUIDED DRAINAGE BY PERCUTANEOUS CATHETER  Result Date: 05/27/2022 INDICATION: Recent cholecystectomy with gallbladder fossa collection. EXAM: CT GUIDED DRAINAGE CATHETER PLACEMENT INTO GALLBLADDER FOSSA COLLECTION RADIATION DOSE REDUCTION: This exam was performed according to the departmental dose-optimization program which includes automated exposure control, adjustment of the mA and/or kV according to patient size and/or use of iterative  reconstruction technique. COMPARISON:  CT AP, 05/26/2022 and 05/11/2022. MEDICATIONS: The patient is currently admitted to the hospital and receiving intravenous antibiotics. The antibiotics were administered within an appropriate time frame prior to the initiation of the procedure. ANESTHESIA/SEDATION: Moderate (conscious) sedation was employed during this procedure. A total of Versed 2 mg and Fentanyl 100 mcg was administered intravenously. Moderate Sedation Time: 22 minutes. The patient's level of consciousness and vital signs were monitored continuously by radiology nursing throughout the procedure under my direct supervision. CONTRAST:  None COMPLICATIONS: None immediate. PROCEDURE: Informed written consent was obtained from the patient and/or patient's representative after a discussion of the risks, benefits and alternatives to treatment. The patient was placed supine on the CT gantry and a pre procedural CT was performed re-demonstrating the known abscess/fluid collection within the gallbladder fossa. The procedure was planned. A timeout was  performed prior to the initiation of the procedure. The RIGHT upper quadrant was prepped and draped in the usual sterile fashion. The overlying soft tissues were anesthetized with 1% lidocaine with epinephrine. Appropriate trajectory was planned with the use of a 22 gauge spinal needle. An 18 gauge trocar needle was advanced into the abscess/fluid collection and a short Amplatz super stiff wire was coiled within the collection. Appropriate positioning was confirmed with a limited CT scan. The tract was serially dilated allowing placement of a 12 [place] all-purpose drainage catheter. Appropriate positioning was confirmed with a limited postprocedural CT scan. 5 mL of bilious fluid was aspirated and submitted for analysis. The tube was connected to bulb suction and sutured in place. A dressing was placed. The patient tolerated the procedure well without immediate post  procedural complication. IMPRESSION: Successful CT guided placement of a 12 Fr drain catheter into the gallbladder fossa with aspiration of bilious fluid, as above. Samples were sent to the laboratory as requested by the ordering clinical team. Roanna Banning, MD [facility] Radiology Electronically Signed   By: Roanna Banning M.D.   On: 05/27/2022 18:02   NM HEPATOBILIARY LEAK (POST-SURGICAL)  Result Date: 05/27/2022 CLINICAL DATA:  Two weeks status post cholecystectomy. Fluid collection in gallbladder fossa. Evaluate for bile leak. EXAM: NUCLEAR MEDICINE HEPATOBILIARY IMAGING TECHNIQUE: Sequential images of the abdomen were obtained out to 60 minutes following intravenous administration of radiopharmaceutical. RADIOPHARMACEUTICALS:  5.4 mCi Tc-83m  Choletec IV COMPARISON:  None Available. FINDINGS: Prompt uptake and biliary excretion of activity by the liver is seen. No gallbladder activity seen, consistent with prior cholecystectomy. Biliary activity passes into small bowel, consistent with patent common bile duct. No evidence of leak of biliary activity. IMPRESSION: No evidence of postop bile leak. Prior cholecystectomy.  No evidence of biliary obstruction. Electronically Signed   By: Danae Orleans M.D.   On: 05/27/2022 13:58   CT Abdomen Pelvis W Contrast  Result Date: 05/26/2022 CLINICAL DATA:  61 year old male with history of abdominal pain, nausea and diarrhea. Status post cholecystectomy 2 weeks ago. EXAM: CT ABDOMEN AND PELVIS WITH CONTRAST TECHNIQUE: Multidetector CT imaging of the abdomen and pelvis was performed using the standard protocol following bolus administration of intravenous contrast. RADIATION DOSE REDUCTION: This exam was performed according to the departmental dose-optimization program which includes automated exposure control, adjustment of the mA and/or kV according to patient size and/or use of iterative reconstruction technique.  CONTRAST:  ISOVUE-300 IOPAMIDOL (ISOVUE-300) INJECTION 61% COMPARISON:  CT the abdomen and pelvis 05/11/2022. FINDINGS: Lower chest: Linear areas of subsegmental atelectasis in the right lower lobe and inferior segment of the lingula, new compared to the prior study. Hepatobiliary: Postoperative changes of cholecystectomy are noted. In the cholecystectomy bed there is some low-attenuation material with internal gas, potentially indicative of hemostatic material. Surrounding this there is a 7.5 x 4.8 x 6.2 cm postoperative low-attenuation fluid collection which demonstrates some rim enhancement and has additional more isolated internal locules of gas, concerning for potential abscess, although a biloma or other postoperative fluid collection is not excluded. Remaining portions of the liver are otherwise normal in appearance without additional lesions. No intra or extrahepatic biliary ductal dilatation. Pancreas: No definite pancreatic mass or peripancreatic fluid collections or inflammatory changes. Spleen: Unremarkable. Adrenals/Urinary Tract: 3.4 cm low-attenuation lesion in the posterolateral aspect of the interpolar region of the right kidney is compatible with a simple cyst (Bosniak class 1), no imaging follow-up is recommended. Left kidney and bilateral  adrenal glands are normal in appearance. No hydroureteronephrosis. Urinary bladder is normal in appearance. Stomach/Bowel: Normal appearance of the stomach. No pathologic dilatation of small bowel or colon. Numerous colonic diverticulae are noted, particularly in the descending colon and sigmoid colon, without surrounding inflammatory changes to suggest an acute diverticulitis at this time. The appendix is not confidently identified and may be surgically absent. Regardless, there are no inflammatory changes noted adjacent to the cecum to suggest the presence of an acute appendicitis at this time. Vascular/Lymphatic: Aortic atherosclerosis, without evidence  of aneurysm or dissection in the abdominal or pelvic vasculature. No lymphadenopathy noted in the abdomen or pelvis. Reproductive: Prostate gland and seminal vesicles are unremarkable in appearance. Other: No significant volume of ascites.  No pneumoperitoneum. Musculoskeletal: There are no aggressive appearing lytic or blastic lesions noted in the visualized portions of the skeleton. IMPRESSION: 1. Large postoperative fluid collection in the gallbladder fossa adjacent to the site of prior cholecystectomy. This contains both low-attenuation fluid and gas, with peripheral rim enhancement and surrounding inflammatory changes, suspicious for abscess, although biloma or other postoperative fluid collection is not excluded. Electronically Signed   By: Trudie Reed M.D.   On: 05/26/2022 10:58      LOS: 2 days    Joycelyn Das, MD [facility] Available via Epic secure chat 7am-7pm After these hours, please refer to coverage provider listed on [email or web address] 05/28/2022, 10:18 AM

## 2022-05-28 NOTE — Progress Notes (Signed)
Subjective: CC: Patient is status post lap chole for gangrenous cholecystitis by Dr. Sheliah Hatch on 6/3.  Discharged POD 1.  Reports since discharge he has been having constant RUQ pain with fatigue, nausea and diarrhea.  CT A/P showed large postoperative fluid collection in the gallbladder fossa adjacent to the site of prior cholecystectomy containing both low-attenuation fluid and gas, with peripheral rim enhancement and surrounding inflammatory changes. Directed to ED. WBC wnl, LFT's wnl. Lipase noted to be 58. Was admitted by [facility] and IR performed Perc Drainage on 6/16.   Feeling better today, just sore from procedure  Objective: Vital signs in last 24 hours: Temp:  [98 F (36.7 C)-98.6 F (37 C)] 98.6 F (37 C) (06/17 0310) Pulse Rate:  [43-60] 55 (06/17 0310) Resp:  [9-18] 12 (06/17 0310) BP: (109-157)/(61-76) 120/62 (06/17 0310) SpO2:  [89 %-100 %] 91 % (06/17 0310) Last BM Date : 05/26/22  Intake/Output from previous day: 06/16 0701 - 06/17 0700 In: 2080.9 [P.O.:240; I.V.:1695.4; IV Piggyback:135.4] Out: 840 [Urine:800; Drains:40] Intake/Output this shift: No intake/output data recorded.  PE: Gen:  Alert, NAD, pleasant Card:  Reg Pulm:  CTAB, no W/R/R, effort normal Abd: Soft, ND, TTP in RUQ, +BS, Incisions with glue intact appears well and are without drainage, bleeding, or signs of infection. IR drain with dark old appearing blood in JP Psych: A&Ox3   Lab Results:  Recent Labs    05/27/22 0415 05/28/22 0224  WBC 8.0 10.2  HGB 10.5* 10.3*  HCT 32.0* 31.7*  PLT 461* 461*    BMET Recent Labs    05/27/22 0415 05/28/22 0224  NA 142 135  K 3.7 3.9  CL 110 103  CO2 25 25  GLUCOSE 112* 138*  BUN 8 7  CREATININE 0.75 0.85  CALCIUM 8.7* 8.5*    PT/INR Recent Labs    05/27/22 0802  LABPROT 13.6  INR 1.1    CMP     Component Value Date/Time   NA 135 05/28/2022 0224   K 3.9 05/28/2022 0224   CL 103 05/28/2022 0224   CO2 25 05/28/2022 0224    GLUCOSE 138 (H) 05/28/2022 0224   BUN 7 05/28/2022 0224   CREATININE 0.85 05/28/2022 0224   CALCIUM 8.5 (L) 05/28/2022 0224   PROT 5.4 (L) 05/28/2022 0224   ALBUMIN 2.7 (L) 05/28/2022 0224   AST 17 05/28/2022 0224   ALT 23 05/28/2022 0224   ALKPHOS 79 05/28/2022 0224   BILITOT 0.4 05/28/2022 0224   GFRNONAA >60 05/28/2022 0224   GFRAA >60 08/23/2017 0525   Lipase     Component Value Date/Time   LIPASE 34 05/28/2022 0224    Studies/Results: CT IMAGE GUIDED DRAINAGE BY PERCUTANEOUS CATHETER  Result Date: 05/27/2022 INDICATION: Recent cholecystectomy with gallbladder fossa collection. EXAM: CT GUIDED DRAINAGE CATHETER PLACEMENT INTO GALLBLADDER FOSSA COLLECTION RADIATION DOSE REDUCTION: This exam was performed according to the departmental dose-optimization program which includes automated exposure control, adjustment of the mA and/or kV according to patient size and/or use of iterative reconstruction technique. COMPARISON:  CT AP, 05/26/2022 and 05/11/2022. MEDICATIONS: The patient is currently admitted to the hospital and receiving intravenous antibiotics. The antibiotics were administered within an appropriate time frame prior to the initiation of the procedure. ANESTHESIA/SEDATION: Moderate (conscious) sedation was employed during this procedure. A total of Versed 2 mg and Fentanyl 100 mcg was administered intravenously. Moderate Sedation Time: 22 minutes. The patient's level of consciousness and vital signs were monitored continuously by radiology  nursing throughout the procedure under my direct supervision. CONTRAST:  None COMPLICATIONS: None immediate. PROCEDURE: Informed written consent was obtained from the patient and/or patient's representative after a discussion of the risks, benefits and alternatives to treatment. The patient was placed supine on the CT gantry and a pre procedural CT was performed re-demonstrating the known abscess/fluid collection within the gallbladder fossa. The  procedure was planned. A timeout was performed prior to the initiation of the procedure. The RIGHT upper quadrant was prepped and draped in the usual sterile fashion. The overlying soft tissues were anesthetized with 1% lidocaine with epinephrine. Appropriate trajectory was planned with the use of a 22 gauge spinal needle. An 18 gauge trocar needle was advanced into the abscess/fluid collection and a short Amplatz super stiff wire was coiled within the collection. Appropriate positioning was confirmed with a limited CT scan. The tract was serially dilated allowing placement of a 12 [place] all-purpose drainage catheter. Appropriate positioning was confirmed with a limited postprocedural CT scan. 5 mL of bilious fluid was aspirated and submitted for analysis. The tube was connected to bulb suction and sutured in place. A dressing was placed. The patient tolerated the procedure well without immediate post procedural complication. IMPRESSION: Successful CT guided placement of a 12 Fr drain catheter into the gallbladder fossa with aspiration of bilious fluid, as above. Samples were sent to the laboratory as requested by the ordering clinical team. Roanna Banning, MD [facility] Radiology Electronically Signed   By: Roanna Banning M.D.   On: 05/27/2022 18:02   NM HEPATOBILIARY LEAK (POST-SURGICAL)  Result Date: 05/27/2022 CLINICAL DATA:  Two weeks status post cholecystectomy. Fluid collection in gallbladder fossa. Evaluate for bile leak. EXAM: NUCLEAR MEDICINE HEPATOBILIARY IMAGING TECHNIQUE: Sequential images of the abdomen were obtained out to 60 minutes following intravenous administration of radiopharmaceutical. RADIOPHARMACEUTICALS:  5.4 mCi Tc-78m  Choletec IV COMPARISON:  None Available. FINDINGS: Prompt uptake and biliary excretion of activity by the liver is seen. No gallbladder activity seen, consistent with prior cholecystectomy. Biliary activity passes into small  bowel, consistent with patent common bile duct. No evidence of leak of biliary activity. IMPRESSION: No evidence of postop bile leak. Prior cholecystectomy.  No evidence of biliary obstruction. Electronically Signed   By: Danae Orleans M.D.   On: 05/27/2022 13:58   CT Abdomen Pelvis W Contrast  Result Date: 05/26/2022 CLINICAL DATA:  61 year old male with history of abdominal pain, nausea and diarrhea. Status post cholecystectomy 2 weeks ago. EXAM: CT ABDOMEN AND PELVIS WITH CONTRAST TECHNIQUE: Multidetector CT imaging of the abdomen and pelvis was performed using the standard protocol following bolus administration of intravenous contrast. RADIATION DOSE REDUCTION: This exam was performed according to the departmental dose-optimization program which includes automated exposure control, adjustment of the mA and/or kV according to patient size and/or use of iterative reconstruction technique. CONTRAST:  ISOVUE-300 IOPAMIDOL (ISOVUE-300) INJECTION 61% COMPARISON:  CT the abdomen and pelvis 05/11/2022. FINDINGS: Lower chest: Linear areas of subsegmental atelectasis in the right lower lobe and inferior segment of the lingula, new compared to the prior study. Hepatobiliary: Postoperative changes of cholecystectomy are noted. In the cholecystectomy bed there is some low-attenuation material with internal gas, potentially indicative of hemostatic material. Surrounding this there is a 7.5 x 4.8 x 6.2 cm postoperative low-attenuation fluid collection which demonstrates some rim enhancement and has additional more isolated internal locules of gas, concerning for potential abscess, although a biloma or other postoperative fluid collection is not excluded. Remaining  portions of the liver are otherwise normal in appearance without additional lesions. No intra or extrahepatic biliary ductal dilatation. Pancreas: No definite pancreatic mass or peripancreatic fluid collections or inflammatory changes. Spleen: Unremarkable.  Adrenals/Urinary Tract: 3.4 cm low-attenuation lesion in the posterolateral aspect of the interpolar region of the right kidney is compatible with a simple cyst (Bosniak class 1), no imaging follow-up is recommended. Left kidney and bilateral adrenal glands are normal in appearance. No hydroureteronephrosis. Urinary bladder is normal in appearance. Stomach/Bowel: Normal appearance of the stomach. No pathologic dilatation of small bowel or colon. Numerous colonic diverticulae are noted, particularly in the descending colon and sigmoid colon, without surrounding inflammatory changes to suggest an acute diverticulitis at this time. The appendix is not confidently identified and may be surgically absent. Regardless, there are no inflammatory changes noted adjacent to the cecum to suggest the presence of an acute appendicitis at this time. Vascular/Lymphatic: Aortic atherosclerosis, without evidence of aneurysm or dissection in the abdominal or pelvic vasculature. No lymphadenopathy noted in the abdomen or pelvis. Reproductive: Prostate gland and seminal vesicles are unremarkable in appearance. Other: No significant volume of ascites.  No pneumoperitoneum. Musculoskeletal: There are no aggressive appearing lytic or blastic lesions noted in the visualized portions of the skeleton. IMPRESSION: 1. Large postoperative fluid collection in the gallbladder fossa adjacent to the site of prior cholecystectomy. This contains both low-attenuation fluid and gas, with peripheral rim enhancement and surrounding inflammatory changes, suspicious for abscess, although biloma or other postoperative fluid collection is not excluded. Electronically Signed   By: Trudie Reed M.D.   On: 05/26/2022 10:58    Anti-infectives: Anti-infectives (From admission, onward)    Start     Dose/Rate Route Frequency Ordered Stop   05/26/22 2200  piperacillin-tazobactam (ZOSYN) IVPB 3.375 g        3.375 g 12.5 mL/hr over 240 Minutes Intravenous  Every 8 hours 05/26/22 2119          Assessment/Plan GB fossa fluid collection Hx Laparoscopic Cholecystectomy on 6/3 by Dr. Sheliah Hatch for Gangrenous Cholecystitis. Path with Acute Cholecystitis and Cholelithiasis  -S/p IR perc drain on 6/16 with 64mL of bilious output aspirated. Cx's pending. Given aspirated fluid appeared bilious, HIDA was obtained to eval for possible bile leak.  This was neg - Cont abx. Await cx - Trend labs - We will follow with you - hopefully will be stable for d/c tom if tolerates diet and wbc stays WNL FEN - advanced to low fat diet VTE - SCDs, okay for chemical prophylaxis from a general surgery standpoint ID - Zosyn 6/15 >>   CAD HTN HLD DM   LOS: 2 days    Vanita Panda , MD [facility] Surgery 05/28/2022, 7:53 AM Please see Amion for pager number during day hours 7:00am-4:30pm

## 2022-05-28 NOTE — Progress Notes (Signed)
[facility] floor coverage for both [facility] and [facility] (remote) on night of 05/27/22 into morning of 05/28/22:    I was notified by RN that the patient is complaining of nausea, but does not currently have any prn antiemetics ordered.  I subsequently placed order for prn IV Zofran.    Newton Pigg, DO Hospitalist

## 2022-05-29 DIAGNOSIS — I10 Essential (primary) hypertension: Secondary | ICD-10-CM | POA: Diagnosis not present

## 2022-05-29 DIAGNOSIS — E119 Type 2 diabetes mellitus without complications: Secondary | ICD-10-CM | POA: Diagnosis not present

## 2022-05-29 DIAGNOSIS — R188 Other ascites: Secondary | ICD-10-CM | POA: Diagnosis not present

## 2022-05-29 DIAGNOSIS — I2510 Atherosclerotic heart disease of native coronary artery without angina pectoris: Secondary | ICD-10-CM | POA: Diagnosis not present

## 2022-05-29 LAB — COMPREHENSIVE METABOLIC PANEL
ALT: 19 U/L (ref 0–44)
AST: 13 U/L — ABNORMAL LOW (ref 15–41)
Albumin: 2.8 g/dL — ABNORMAL LOW (ref 3.5–5.0)
Alkaline Phosphatase: 73 U/L (ref 38–126)
Anion gap: 9 (ref 5–15)
BUN: 9 mg/dL (ref 6–20)
CO2: 26 mmol/L (ref 22–32)
Calcium: 8.9 mg/dL (ref 8.9–10.3)
Chloride: 104 mmol/L (ref 98–111)
Creatinine, Ser: 1.06 mg/dL (ref 0.61–1.24)
GFR, Estimated: >60 mL/min (ref >60)
Glucose, Bld: 178 mg/dL — ABNORMAL HIGH (ref 70–99)
Potassium: 3.6 mmol/L (ref 3.5–5.1)
Sodium: 139 mmol/L (ref 135–145)
Total Bilirubin: 0.2 mg/dL — ABNORMAL LOW (ref 0.3–1.2)
Total Protein: 5.3 g/dL — ABNORMAL LOW (ref 6.5–8.1)

## 2022-05-29 LAB — CBC
HCT: 32.2 % — ABNORMAL LOW (ref 39.0–52.0)
Hemoglobin: 10.7 g/dL — ABNORMAL LOW (ref 13.0–17.0)
MCH: 32.1 pg (ref 26.0–34.0)
MCHC: 33.2 g/dL (ref 30.0–36.0)
MCV: 96.7 fL (ref 80.0–100.0)
Platelets: 454 10*3/uL — ABNORMAL HIGH (ref 150–400)
RBC: 3.33 MIL/uL — ABNORMAL LOW (ref 4.22–5.81)
RDW: 13.4 % (ref 11.5–15.5)
WBC: 9.7 10*3/uL (ref 4.0–10.5)
nRBC: 0 % (ref 0.0–0.2)

## 2022-05-29 LAB — GLUCOSE, CAPILLARY: Glucose-Capillary: 143 mg/dL — ABNORMAL HIGH (ref 70–99)

## 2022-05-29 LAB — MAGNESIUM: Magnesium: 1.8 mg/dL (ref 1.7–2.4)

## 2022-05-29 MED ORDER — ONDANSETRON HCL 4 MG PO TABS: 4.0000 mg | ORAL_TABLET | Freq: Three times a day (TID) | ORAL | 0 refills | Status: AC | PRN

## 2022-05-29 MED ORDER — AMOXICILLIN-POT CLAVULANATE 875-125 MG PO TABS: 1.0000 | ORAL_TABLET | Freq: Two times a day (BID) | ORAL | 0 refills | Status: AC

## 2022-05-29 NOTE — Progress Notes (Signed)
Subjective: CC: Patient is status post lap chole for gangrenous cholecystitis by Dr. Sheliah Hatch on 6/3.  Discharged POD 1.  Returned to ED stating he had been having constant RUQ pain with fatigue, nausea and diarrhea since d/c.  CT A/P showed large postoperative fluid collection in the gallbladder fossa concerning for abscess. WBC wnl, LFT's wnl. Was admitted by [facility] and IR performed Perc Drainage on 6/16.   Feeling better today, less sore.  Tolerating a diet  Objective: Vital signs in last 24 hours: Temp:  [98 F (36.7 C)-98.2 F (36.8 C)] 98.2 F (36.8 C) (06/17 2313) Pulse Rate:  [49-64] 60 (06/17 2313) Resp:  [14-21] 16 (06/17 2313) BP: (123-143)/(64-102) 141/102 (06/17 2313) SpO2:  [90 %-96 %] 92 % (06/17 2313) Last BM Date : 05/26/22  Intake/Output from previous day: 06/17 0701 - 06/18 0700 In: 1015.7 [I.V.:849.9; IV Piggyback:165.7] Out: 310 [Urine:300; Drains:10] Intake/Output this shift: No intake/output data recorded.  PE: Gen:  Alert, NAD, pleasant Abd: Soft, ND, min TTP in RUQ, Incisions with glue intact appears well and are without drainage, bleeding, or signs of infection. IR drain with dark old appearing blood and SSF in JP Psych: A&Ox3   Lab Results:  Recent Labs    05/28/22 0224 05/29/22 0124  WBC 10.2 9.7  HGB 10.3* 10.7*  HCT 31.7* 32.2*  PLT 461* 454*    BMET Recent Labs    05/28/22 0224 05/29/22 0124  NA 135 139  K 3.9 3.6  CL 103 104  CO2 25 26  GLUCOSE 138* 178*  BUN 7 9  CREATININE 0.85 1.06  CALCIUM 8.5* 8.9    PT/INR Recent Labs    05/27/22 0802  LABPROT 13.6  INR 1.1    CMP     Component Value Date/Time   NA 139 05/29/2022 0124   K 3.6 05/29/2022 0124   CL 104 05/29/2022 0124   CO2 26 05/29/2022 0124   GLUCOSE 178 (H) 05/29/2022 0124   BUN 9 05/29/2022 0124   CREATININE 1.06 05/29/2022 0124   CALCIUM 8.9 05/29/2022 0124   PROT 5.3 (L) 05/29/2022 0124   ALBUMIN 2.8 (L) 05/29/2022 0124   AST 13 (L)  05/29/2022 0124   ALT 19 05/29/2022 0124   ALKPHOS 73 05/29/2022 0124   BILITOT 0.2 (L) 05/29/2022 0124   GFRNONAA >60 05/29/2022 0124   GFRAA >60 08/23/2017 0525   Lipase     Component Value Date/Time   LIPASE 34 05/28/2022 0224    Studies/Results: CT IMAGE GUIDED DRAINAGE BY PERCUTANEOUS CATHETER  Result Date: 05/27/2022 INDICATION: Recent cholecystectomy with gallbladder fossa collection. EXAM: CT GUIDED DRAINAGE CATHETER PLACEMENT INTO GALLBLADDER FOSSA COLLECTION RADIATION DOSE REDUCTION: This exam was performed according to the departmental dose-optimization program which includes automated exposure control, adjustment of the mA and/or kV according to patient size and/or use of iterative reconstruction technique. COMPARISON:  CT AP, 05/26/2022 and 05/11/2022. MEDICATIONS: The patient is currently admitted to the hospital and receiving intravenous antibiotics. The antibiotics were administered within an appropriate time frame prior to the initiation of the procedure. ANESTHESIA/SEDATION: Moderate (conscious) sedation was employed during this procedure. A total of Versed 2 mg and Fentanyl 100 mcg was administered intravenously. Moderate Sedation Time: 22 minutes. The patient's level of consciousness and vital signs were monitored continuously by radiology nursing throughout the procedure under my direct supervision. CONTRAST:  None COMPLICATIONS: None immediate. PROCEDURE: Informed written consent was obtained from the patient and/or patient's representative after a discussion  of the risks, benefits and alternatives to treatment. The patient was placed supine on the CT gantry and a pre procedural CT was performed re-demonstrating the known abscess/fluid collection within the gallbladder fossa. The procedure was planned. A timeout was performed prior to the initiation of the procedure. The RIGHT upper quadrant was prepped and draped in the usual sterile fashion. The overlying soft tissues were  anesthetized with 1% lidocaine with epinephrine. Appropriate trajectory was planned with the use of a 22 gauge spinal needle. An 18 gauge trocar needle was advanced into the abscess/fluid collection and a short Amplatz super stiff wire was coiled within the collection. Appropriate positioning was confirmed with a limited CT scan. The tract was serially dilated allowing placement of a 12 [place] all-purpose drainage catheter. Appropriate positioning was confirmed with a limited postprocedural CT scan. 5 mL of bilious fluid was aspirated and submitted for analysis. The tube was connected to bulb suction and sutured in place. A dressing was placed. The patient tolerated the procedure well without immediate post procedural complication. IMPRESSION: Successful CT guided placement of a 12 Fr drain catheter into the gallbladder fossa with aspiration of bilious fluid, as above. Samples were sent to the laboratory as requested by the ordering clinical team. Roanna Banning, MD [facility] Radiology Electronically Signed   By: Roanna Banning M.D.   On: 05/27/2022 18:02   NM HEPATOBILIARY LEAK (POST-SURGICAL)  Result Date: 05/27/2022 CLINICAL DATA:  Two weeks status post cholecystectomy. Fluid collection in gallbladder fossa. Evaluate for bile leak. EXAM: NUCLEAR MEDICINE HEPATOBILIARY IMAGING TECHNIQUE: Sequential images of the abdomen were obtained out to 60 minutes following intravenous administration of radiopharmaceutical. RADIOPHARMACEUTICALS:  5.4 mCi Tc-54m  Choletec IV COMPARISON:  None Available. FINDINGS: Prompt uptake and biliary excretion of activity by the liver is seen. No gallbladder activity seen, consistent with prior cholecystectomy. Biliary activity passes into small bowel, consistent with patent common bile duct. No evidence of leak of biliary activity. IMPRESSION: No evidence of postop bile leak. Prior cholecystectomy.  No evidence of biliary obstruction.  Electronically Signed   By: Danae Orleans M.D.   On: 05/27/2022 13:58    Anti-infectives: Anti-infectives (From admission, onward)    Start     Dose/Rate Route Frequency Ordered Stop   05/26/22 2200  piperacillin-tazobactam (ZOSYN) IVPB 3.375 g        3.375 g 12.5 mL/hr over 240 Minutes Intravenous Every 8 hours 05/26/22 2119          Assessment/Plan GB fossa fluid collection Hx Laparoscopic Cholecystectomy on 6/3 by Dr. Sheliah Hatch for Gangrenous Cholecystitis. Path with Acute Cholecystitis and Cholelithiasis  -S/p IR perc drain on 6/16 with 41mL of bilious output aspirated. Cx's pending. Given aspirated fluid appeared bilious, HIDA was obtained to eval for possible bile leak.  This was neg - Cont abx. Await cx (NGTD) - seems to be stable for d/c  FEN - advanced to low fat diet VTE - SCDs, okay for chemical prophylaxis from a general surgery standpoint ID - Zosyn 6/15 >> ok to switch to Augmentin for 4 more days as an outpatient -F/u with Dr Sheliah Hatch in the office in 2 wks -F/u with IR outpatient in 10-14 days CAD HTN HLD DM   LOS: 3 days    Vanita Panda , MD [facility] Surgery 05/29/2022, 7:53 AM Please see Amion for pager number during day hours 7:00am-4:30pm

## 2022-05-29 NOTE — Discharge Summary (Signed)
Physician Discharge Summary  Garo Heidelberg LOV:[id number] DOB: 16-Jul-1961 DOA: 05/26/2022  PCP: [name], MD  Admit date: 05/26/2022 Discharge date: 05/29/2022  Admitted From: Home  Discharge disposition: Home  Recommendations for Outpatient Follow-Up:   Follow up with your primary care provider in 2 weeks. Check CBC, BMP, magnesium in the next visit Follow-up with Dr. Sheliah Hatch general surgery in the office in 2 weeks. Follow-up with interventional radiology in 10 to 14 days.  Discharge Diagnosis:   Principal Problem:   Intra-abdominal fluid collection Active Problems:   Coronary artery disease involving native coronary artery of native heart without angina pectoris   Diabetes mellitus type 2, noninsulin dependent (HCC)   Essential hypertension   Discharge Condition: Improved.  Diet recommendation: Low sodium, heart healthy.  Diabetic  Wound care: Local tube care  Code status: Full.  History of Present Illness:    Benjamin Riley is a 61 years old male with past medical history of CAD, type 2 diabetes, hypertension and cholecystitis status post laparoscopic cholecystectomy on 05/14/2022 presented to the hospital with right upper quadrant pain.  Patient reported that he has had persistent pain in the right upper quadrant since returning home from the hospital after cholecystectomy with some loose stools, poor appetite but no fever or chills.  He was then evaluated with a CT scan and pelvis in the ED which showed a large postoperative fluid collection in the gallbladder fossa with gas, rim enhancement, and surrounding inflammatory changes concerning for abscess.  In the ED, blood cultures were collected and patient was empirically started on Zosyn and was admitted to hospital.   Hospital Course:   Following conditions were addressed during hospitalization as listed below,  Intraabdominal fluid collection  Status post laparoscopic cholecystectomy 05/14/2022.  Outpatient  CT scan showed fluid collection.  HIDA scan was negative for biliary leakage.  No sepsis.  Blood cultures negative.  Patient received IV Zosyn during hospitalization which will be changed to oral Augmentin for the next 4 days on discharge as per surgical recommendations.  IR was consulted and patient underwent right upper quadrant percutaneous drain placement on 05/27/2022.  IR to follow the patient as outpatient for tube management.  Fluid culture  negative so far.    General surgery has seen the patient today stable for disposition home today.  Patient will need to follow-up with surgery in 2 weeks and with interventional radiology in 10 to 14 days as outpatient.   CAD  Patient with history of LAD PCI almost 10 years back.  No active issues at this time.  Patient will resume home medications on discharge.   Essential HTN  Patient is on losartan at home.  We will continue on discharge.  Type II DM  Continue glipizide, Jardiance, metformin and Actos at home with semaglutide.   Disposition.  At this time, patient is stable for disposition home with outpatient PCP, general surgery and IR follow-up.  Medical Consultants:   General surgery Interventional radiology GI  Procedures:    IR guided biliary drain  placement on 05/27/2022 Subjective:   Today, patient was seen and examined at bedside.  In good spirits.  Denies any pain, nausea, vomiting or shortness of breath.  Wishes to go home.  Discharge Exam:   Vitals:   05/28/22 2313 05/29/22 0808  BP: (!) 141/102 (!) 148/73  Pulse: 60 (!) 57  Resp: 16 17  Temp: 98.2 F (36.8 C) 98.1 F (36.7 C)  SpO2: 92%    Vitals:  05/28/22 1947 05/28/22 2000 05/28/22 2313 05/29/22 0808  BP: (!) 142/67 136/72 (!) 141/102 (!) 148/73  Pulse: 64 60 60 (!) 57  Resp: 20 (!) 21 16 17   Temp: 98 F (36.7 C)  98.2 F (36.8 C) 98.1 F (36.7 C)  TempSrc: Oral  Oral Oral  SpO2: 90% 91% 92%   Weight:      Height:       Body mass index is 33.72  kg/m.   General: Obese, alert awake, not in obvious distress HENT: pupils equally reacting to light,  No scleral pallor or icterus noted. Oral mucosa is moist.  Chest:  Clear breath sounds.  Diminished breath sounds bilaterally. No crackles or wheezes.  CVS: S1 &S2 heard. No murmur.  Regular rate and rhythm. Abdomen: Soft, nontender, nondistended.  Bowel sounds are heard.  Right upper quadrant tenderness on palpation with mild guarding.  Right upper quadrant drain in place. Extremities: No cyanosis, clubbing or edema.  Peripheral pulses are palpable. Psych: Alert, awake and oriented, normal mood CNS:  No cranial nerve deficits.  Moves all extremities. Skin: Warm and dry.  No rashes noted.  The results of significant diagnostics from this hospitalization (including imaging, microbiology, ancillary and laboratory) are listed below for reference.     Diagnostic Studies:   CT IMAGE GUIDED DRAINAGE BY PERCUTANEOUS CATHETER  Result Date: 05/27/2022 INDICATION: Recent cholecystectomy with gallbladder fossa collection. EXAM: CT GUIDED DRAINAGE CATHETER PLACEMENT INTO GALLBLADDER FOSSA COLLECTION RADIATION DOSE REDUCTION: This exam was performed according to the departmental dose-optimization program which includes automated exposure control, adjustment of the mA and/or kV according to patient size and/or use of iterative reconstruction technique. COMPARISON:  CT AP, 05/26/2022 and 05/11/2022. MEDICATIONS: The patient is currently admitted to the hospital and receiving intravenous antibiotics. The antibiotics were administered within an appropriate time frame prior to the initiation of the procedure. ANESTHESIA/SEDATION: Moderate (conscious) sedation was employed during this procedure. A total of Versed 2 mg and Fentanyl 100 mcg was administered intravenously. Moderate Sedation Time: 22 minutes. The patient's level of consciousness and vital signs were monitored continuously by radiology nursing  throughout the procedure under my direct supervision. CONTRAST:  None COMPLICATIONS: None immediate. PROCEDURE: Informed written consent was obtained from the patient and/or patient's representative after a discussion of the risks, benefits and alternatives to treatment. The patient was placed supine on the CT gantry and a pre procedural CT was performed re-demonstrating the known abscess/fluid collection within the gallbladder fossa. The procedure was planned. A timeout was performed prior to the initiation of the procedure. The RIGHT upper quadrant was prepped and draped in the usual sterile fashion. The overlying soft tissues were anesthetized with 1% lidocaine with epinephrine. Appropriate trajectory was planned with the use of a 22 gauge spinal needle. An 18 gauge trocar needle was advanced into the abscess/fluid collection and a short Amplatz super stiff wire was coiled within the collection. Appropriate positioning was confirmed with a limited CT scan. The tract was serially dilated allowing placement of a 12 05/13/2022 all-purpose drainage catheter. Appropriate positioning was confirmed with a limited postprocedural CT scan. 5 mL of bilious fluid was aspirated and submitted for analysis. The tube was connected to bulb suction and sutured in place. A dressing was placed. The patient tolerated the procedure well without immediate post procedural complication. IMPRESSION: Successful CT guided placement of a 12 Fr drain catheter into the gallbladder fossa with aspiration of bilious fluid, as above. Samples were sent to the laboratory as requested by  the ordering clinical team. Roanna Banning, MD [facility] Radiology Electronically Signed   By: Roanna Banning M.D.   On: 05/27/2022 18:02   NM HEPATOBILIARY LEAK (POST-SURGICAL)  Result Date: 05/27/2022 CLINICAL DATA:  Two weeks status post cholecystectomy. Fluid collection in gallbladder fossa. Evaluate for bile leak.  EXAM: NUCLEAR MEDICINE HEPATOBILIARY IMAGING TECHNIQUE: Sequential images of the abdomen were obtained out to 60 minutes following intravenous administration of radiopharmaceutical. RADIOPHARMACEUTICALS:  5.4 mCi Tc-51m  Choletec IV COMPARISON:  None Available. FINDINGS: Prompt uptake and biliary excretion of activity by the liver is seen. No gallbladder activity seen, consistent with prior cholecystectomy. Biliary activity passes into small bowel, consistent with patent common bile duct. No evidence of leak of biliary activity. IMPRESSION: No evidence of postop bile leak. Prior cholecystectomy.  No evidence of biliary obstruction. Electronically Signed   By: Danae Orleans M.D.   On: 05/27/2022 13:58   CT Abdomen Pelvis W Contrast  Result Date: 05/26/2022 CLINICAL DATA:  61 year old male with history of abdominal pain, nausea and diarrhea. Status post cholecystectomy 2 weeks ago. EXAM: CT ABDOMEN AND PELVIS WITH CONTRAST TECHNIQUE: Multidetector CT imaging of the abdomen and pelvis was performed using the standard protocol following bolus administration of intravenous contrast. RADIATION DOSE REDUCTION: This exam was performed according to the departmental dose-optimization program which includes automated exposure control, adjustment of the mA and/or kV according to patient size and/or use of iterative reconstruction technique. CONTRAST:  ISOVUE-300 IOPAMIDOL (ISOVUE-300) INJECTION 61% COMPARISON:  CT the abdomen and pelvis 05/11/2022. FINDINGS: Lower chest: Linear areas of subsegmental atelectasis in the right lower lobe and inferior segment of the lingula, new compared to the prior study. Hepatobiliary: Postoperative changes of cholecystectomy are noted. In the cholecystectomy bed there is some low-attenuation material with internal gas, potentially indicative of hemostatic material. Surrounding this there is a 7.5 x 4.8 x 6.2 cm postoperative low-attenuation fluid collection which demonstrates some rim  enhancement and has additional more isolated internal locules of gas, concerning for potential abscess, although a biloma or other postoperative fluid collection is not excluded. Remaining portions of the liver are otherwise normal in appearance without additional lesions. No intra or extrahepatic biliary ductal dilatation. Pancreas: No definite pancreatic mass or peripancreatic fluid collections or inflammatory changes. Spleen: Unremarkable. Adrenals/Urinary Tract: 3.4 cm low-attenuation lesion in the posterolateral aspect of the interpolar region of the right kidney is compatible with a simple cyst (Bosniak class 1), no imaging follow-up is recommended. Left kidney and bilateral adrenal glands are normal in appearance. No hydroureteronephrosis. Urinary bladder is normal in appearance. Stomach/Bowel: Normal appearance of the stomach. No pathologic dilatation of small bowel or colon. Numerous colonic diverticulae are noted, particularly in the descending colon and sigmoid colon, without surrounding inflammatory changes to suggest an acute diverticulitis at this time. The appendix is not confidently identified and may be surgically absent. Regardless, there are no inflammatory changes noted adjacent to the cecum to suggest the presence of an acute appendicitis at this time. Vascular/Lymphatic: Aortic atherosclerosis, without evidence of aneurysm or dissection in the abdominal or pelvic vasculature. No lymphadenopathy noted in the abdomen or pelvis. Reproductive: Prostate gland and seminal vesicles are unremarkable in appearance. Other: No significant volume of ascites.  No pneumoperitoneum. Musculoskeletal: There are no aggressive appearing lytic or blastic lesions noted in the visualized portions of the skeleton. IMPRESSION: 1. Large postoperative fluid collection in the gallbladder fossa adjacent to the site of prior cholecystectomy. This contains both low-attenuation fluid and  gas, with peripheral rim enhancement  and surrounding inflammatory changes, suspicious for abscess, although biloma or other postoperative fluid collection is not excluded. Electronically Signed   By: Trudie Reedaniel  Entrikin M.D.   On: 05/26/2022 10:58     Labs:   Basic Metabolic Panel: Recent Labs  Lab 05/24/22 0759 05/26/22 1837 05/27/22 0415 05/28/22 0224 05/29/22 0124  NA 139 141 142 135 139  K 4.0 4.0 3.7 3.9 3.6  CL 102 104 110 103 104  CO2 27 26 25 25 26   GLUCOSE 159* 154* 112* 138* 178*  BUN 9 10 8 7 9   CREATININE 0.78 0.91 0.75 0.85 1.06  CALCIUM 9.6 9.7 8.7* 8.5* 8.9  MG  --   --   --  1.8 1.8   GFR Estimated Creatinine Clearance: 90.6 mL/min (by C-G formula based on SCr of 1.06 mg/dL). Liver Function Tests: Recent Labs  Lab 05/24/22 0759 05/26/22 1837 05/27/22 0415 05/28/22 0224 05/29/22 0124  AST 20 22 19 17  13*  ALT 26 30 24 23 19   ALKPHOS 96 92 75 79 73  BILITOT 0.3 0.4 0.5 0.4 0.2*  PROT 6.8 6.8 5.2* 5.4* 5.3*  ALBUMIN 3.8 3.5 2.8* 2.7* 2.8*   Recent Labs  Lab 05/26/22 1837 05/28/22 0224  LIPASE 58* 34   No results for input(s): "AMMONIA" in the last 168 hours. Coagulation profile Recent Labs  Lab 05/27/22 0802  INR 1.1    CBC: Recent Labs  Lab 05/24/22 0759 05/26/22 1837 05/27/22 0415 05/28/22 0224 05/29/22 0124  WBC 10.8* 10.4 8.0 10.2 9.7  NEUTROABS 7.8* 6.7  --   --   --   HGB 12.2* 12.0* 10.5* 10.3* 10.7*  HCT 36.7* 36.6* 32.0* 31.7* 32.2*  MCV 94.9 98.4 98.5 97.2 96.7  PLT 554.0* 574* 461* 461* 454*   Cardiac Enzymes: No results for input(s): "CKTOTAL", "CKMB", "CKMBINDEX", "TROPONINI" in the last 168 hours. BNP: Invalid input(s): "POCBNP" CBG: Recent Labs  Lab 05/28/22 1134 05/28/22 1535 05/28/22 1950 05/28/22 2311 05/29/22 0457  GLUCAP 142* 153* 175* 195* 143*   D-Dimer No results for input(s): "DDIMER" in the last 72 hours. Hgb A1c No results for input(s): "HGBA1C" in the last 72 hours. Lipid Profile No results for input(s): "CHOL", "HDL",  "LDLCALC", "TRIG", "CHOLHDL", "LDLDIRECT" in the last 72 hours. Thyroid function studies No results for input(s): "TSH", "T4TOTAL", "T3FREE", "THYROIDAB" in the last 72 hours.  Invalid input(s): "FREET3" Anemia work up No results for input(s): "VITAMINB12", "FOLATE", "FERRITIN", "TIBC", "IRON", "RETICCTPCT" in the last 72 hours. Microbiology Recent Results (from the past 240 hour(s))  Clostridium Difficile by PCR([facility] only )     Status: None   Collection Time: 05/24/22 12:43 PM   Specimen: STOOL   Stool  Result Value Ref Range Status   Toxigenic C. Difficile by PCR Negative Negative Final  Blood culture (routine x 2)     Status: None (Preliminary result)   Collection Time: 05/26/22  9:51 PM   Specimen: BLOOD  Result Value Ref Range Status   Specimen Description BLOOD RIGHT ANTECUBITAL  Final   Special Requests   Final    BOTTLES DRAWN AEROBIC AND ANAEROBIC Blood Culture results may not be optimal due to an excessive volume of blood received in culture bottles   Culture   Final    NO GROWTH 3 DAYS Performed at [facility], [address] [place], [place] [address]    Report Status PENDING  Incomplete  Blood culture (routine x 2)  Status: None (Preliminary result)   Collection Time: 05/26/22 10:09 PM   Specimen: BLOOD  Result Value Ref Range Status   Specimen Description BLOOD LEFT ANTECUBITAL  Final   Special Requests   Final    BOTTLES DRAWN AEROBIC AND ANAEROBIC Blood Culture results may not be optimal due to an excessive volume of blood received in culture bottles   Culture   Final    NO GROWTH 3 DAYS Performed at [facility], [address] [place], [place] [address]    Report Status PENDING  Incomplete  Aerobic/Anaerobic Culture w Gram Stain (surgical/deep wound)     Status: None (Preliminary result)   Collection Time: 05/27/22  9:15 AM   Specimen: Gallbladder; Abscess  Result Value Ref Range Status   Specimen Description GALL BLADDER  Final    Special Requests NONE  Final   Gram Stain   Final    WBC PRESENT,BOTH PMN AND MONONUCLEAR NO ORGANISMS SEEN CYTOSPIN SMEAR    Culture   Final    NO GROWTH 1 DAY Performed at [facility], [address], [place], [place] [address]    Report Status PENDING  Incomplete     Discharge Instructions:   Discharge Instructions     Call MD for:  persistant nausea and vomiting   Complete by: As directed    Call MD for:  severe uncontrolled pain   Complete by: As directed    Call MD for:  temperature >100.4   Complete by: As directed    Diet - low sodium heart healthy   Complete by: As directed    Discharge instructions   Complete by: As directed    Follow-up with Dr. Sheliah Hatch, surgery in the office in 2 weeks.  Follow-up with interventional radiology in 10 to 14 days.  Seek medical attention for worsening symptoms.   Discharge wound care:   Complete by: As directed    need to flush drain daily with 5 cc NS, , dressing changes every 2-3 days or earlier if soiled.   Increase activity slowly   Complete by: As directed       Allergies as of 05/29/2022       Reactions   Lisinopril Other (See Comments)   unknown   Statins Other (See Comments)   Leg cramps with simvastatin and atorvastatin         Medication List     STOP taking these medications    pantoprazole 40 MG tablet Commonly known as: PROTONIX       TAKE these medications    amoxicillin-clavulanate 875-125 MG tablet Commonly known as: AUGMENTIN Take 1 tablet by mouth 2 (two) times daily for 4 days. What changed: Another medication with the same name was removed. Continue taking this medication, and follow the directions you see here.   aspirin EC 81 MG tablet Take 81 mg by mouth daily.   CoQ10 100 MG Caps Take 100 mg by mouth daily.   Cyanocobalamin 2000 MCG Tbcr Take 2,000 mcg by mouth daily.   ezetimibe 10 MG tablet Commonly known as: ZETIA Take 10 mg by mouth daily.   glipiZIDE 10 MG  tablet Commonly known as: GLUCOTROL Take 10 mg by mouth daily before breakfast.   Jardiance 10 MG Tabs tablet Generic drug: empagliflozin Take 10 mg by mouth daily.   loratadine 10 MG tablet Commonly known as: CLARITIN Take 10 mg by mouth daily.   losartan 50 MG tablet Commonly known as: COZAAR Take  1 tablet (50 mg total) by mouth daily.   metFORMIN 500 MG 24 hr tablet Commonly known as: GLUCOPHAGE-XR Take 500 mg by mouth 2 (two) times daily.   nitroGLYCERIN 0.4 MG SL tablet Commonly known as: NITROSTAT Place 1 tablet (0.4 mg total) under the tongue every 5 (five) minutes as needed for chest pain.   ondansetron 4 MG tablet Commonly known as: Zofran Take 1 tablet (4 mg total) by mouth every 8 (eight) hours as needed for up to 5 days for nausea or vomiting.   oxyCODONE 5 MG immediate release tablet Commonly known as: Oxy IR/ROXICODONE Take 1 tablet (5 mg total) by mouth every 6 (six) hours as needed for moderate pain.   Ozempic (2 MG/DOSE) 8 MG/3ML Sopn Generic drug: Semaglutide (2 MG/DOSE) Inject 2 mg into the skin once a week. Sundays   pioglitazone 30 MG tablet Commonly known as: ACTOS Take 30 mg by mouth daily.   Repatha SureClick 140 MG/ML Soaj Generic drug: Evolocumab INJECT 140 MG  UNDER THE SKIN EVERY 2 WEEKS AS DIRECTED What changed: See the new instructions.   sodium chloride flush 0.9 % Soln Commonly known as: NS 10 mLs by Intracatheter route daily for 30 doses. Inject 5-10cc saline into drain catheter daily.   vitamin C 1000 MG tablet Take 1,000 mg by mouth daily.   Vitamin D3 50 MCG (2000 UT) capsule Take 2,000 Units by mouth daily.               Discharge Care Instructions  (From admission, onward)           Start     Ordered   05/29/22 0000  Discharge wound care:       Comments: need to flush drain daily with 5 cc NS, , dressing changes every 2-3 days or earlier if soiled.   05/29/22 0851              Time coordinating  discharge: 39 minutes  Signed:  Caris Cerveny  [facility] 05/29/2022, 8:52 AM

## 2022-05-30 ENCOUNTER — Other Ambulatory Visit (HOSPITAL_COMMUNITY): Payer: Self-pay

## 2022-05-31 ENCOUNTER — Other Ambulatory Visit: Payer: Self-pay | Admitting: General Surgery

## 2022-05-31 DIAGNOSIS — R188 Other ascites: Secondary | ICD-10-CM

## 2022-05-31 DIAGNOSIS — Z9049 Acquired absence of other specified parts of digestive tract: Secondary | ICD-10-CM

## 2022-05-31 LAB — CULTURE, BLOOD (ROUTINE X 2)
Culture: NO GROWTH
Culture: NO GROWTH

## 2022-06-01 LAB — AEROBIC/ANAEROBIC CULTURE W GRAM STAIN (SURGICAL/DEEP WOUND): Culture: NO GROWTH

## 2022-06-03 ENCOUNTER — Encounter: Payer: Self-pay | Admitting: Cardiology

## 2022-06-03 ENCOUNTER — Ambulatory Visit: Payer: No Typology Code available for payment source | Admitting: Cardiology

## 2022-06-03 VITALS — BP 132/81 | HR 88 | Temp 98.5°F | Resp 16 | Ht 70.0 in | Wt 224.8 lb

## 2022-06-03 DIAGNOSIS — I2510 Atherosclerotic heart disease of native coronary artery without angina pectoris: Secondary | ICD-10-CM

## 2022-06-07 ENCOUNTER — Other Ambulatory Visit (HOSPITAL_COMMUNITY): Payer: Self-pay

## 2022-06-07 DIAGNOSIS — Z0279 Encounter for issue of other medical certificate: Secondary | ICD-10-CM

## 2022-06-09 ENCOUNTER — Ambulatory Visit
Admission: RE | Admit: 2022-06-09 | Discharge: 2022-06-09 | Disposition: A | Discharge status: Home / Self Care | Payer: No Typology Code available for payment source | Source: Ambulatory Visit | Attending: General Surgery | Admitting: General Surgery

## 2022-06-09 ENCOUNTER — Ambulatory Visit
Admission: RE | Admit: 2022-06-09 | Discharge: 2022-06-09 | Disposition: A | Discharge status: Home / Self Care | Payer: No Typology Code available for payment source | Source: Ambulatory Visit | Attending: Physician Assistant | Admitting: Physician Assistant

## 2022-06-09 DIAGNOSIS — Z9049 Acquired absence of other specified parts of digestive tract: Secondary | ICD-10-CM

## 2022-06-09 DIAGNOSIS — R188 Other ascites: Secondary | ICD-10-CM

## 2022-06-09 HISTORY — PX: IR RADIOLOGIST EVAL & MGMT: IMG5224

## 2022-06-09 MED ORDER — IOPAMIDOL (ISOVUE-300) INJECTION 61%
100.0000 mL | Freq: Once | INTRAVENOUS | Status: AC | PRN
  Administered 2022-06-09: 100 mL via INTRAVENOUS

## 2022-06-09 NOTE — Progress Notes (Signed)
Referring Physician(s): L Kinsinger  Chief Complaint: The patient is seen in follow up today s/p GB fossa drain placed in IR 05/27/22  History of present illness:  Lap cholecystostomy 05/14/22- Dr Sheliah Hatch Collection development GB fossa and drain placed in IR 6/16 Scheduled today for CT and evaluation  Denies pain/fever/chills OP is minimal to none Flushes with 5 cc daily--- OP about same Finished antibiotics yesterday  Follows up with Dr Sheliah Hatch next week   Past Medical History:  Diagnosis Date   Coronary artery disease    Diabetes mellitus without complication (HCC)    Myocardial infarct [facility])     Past Surgical History:  Procedure Laterality Date   APPENDECTOMY     BACK SURGERY     BIOPSY  05/13/2022   Procedure: BIOPSY;  Surgeon: Beverley Fiedler, MD;  Location: [facility] END[facility];  Service: Gastroenterology;;   CARDIAC CATHETERIZATION     CHOLECYSTECTOMY N/A 05/14/2022   Procedure: LAPAROSCOPIC CHOLECYSTECTOMY;  Surgeon: Rodman Pickle, MD;  Location: [facility] OR;  Service: General;  Laterality: N/A;   CORONARY ANGIOPLASTY     CORONARY STENT PLACEMENT     ESOPHAGOGASTRODUODENOSCOPY (EGD) WITH PROPOFOL N/A 05/13/2022   Procedure: ESOPHAGOGASTRODUODENOSCOPY (EGD) WITH PROPOFOL;  Surgeon: Beverley Fiedler, MD;  Location: [facility] END[facility];  Service: Gastroenterology;  Laterality: N/A;   LEFT HEART CATH AND CORONARY ANGIOGRAPHY N/A 08/24/2017   Procedure: LEFT HEART CATH AND CORONARY ANGIOGRAPHY;  Surgeon: Tonny Bollman, MD;  [facility];  Service: Cardiovascular;  Laterality: N/A;   SINUS SURGERY WITH INSTATRAK      Allergies: Lisinopril and Statins  Medications: Prior to Admission medications   Medication Sig Start Date End Date Taking? Authorizing Provider  Ascorbic Acid (VITAMIN C) 1000 MG tablet Take 1,000 mg by mouth daily.    [provider]  aspirin EC 81 MG tablet Take 81 mg by mouth daily.    [provider]  Cholecalciferol (VITAMIN  D3) 2000 units capsule Take 2,000 Units by mouth daily.    [provider]  Coenzyme Q10 (COQ10) 100 MG CAPS Take 100 mg by mouth daily.    [provider]  Cyanocobalamin 2000 MCG TBCR Take 2,000 mcg by mouth daily.    [provider]  Evolocumab (REPATHA SURECLICK) 140 MG/ML SOAJ INJECT 140 MG  UNDER THE SKIN EVERY 2 WEEKS AS DIRECTED Patient taking differently: Inject 140 mg into the muscle every 14 (fourteen) days. 06/04/21   Patwardhan, [name], MD  ezetimibe (ZETIA) 10 MG tablet Take 10 mg by mouth daily.    [provider]  glipiZIDE (GLUCOTROL) 10 MG tablet Take 10 mg by mouth daily before breakfast. 07/17/17   [provider]  JARDIANCE 10 MG TABS tablet Take 10 mg by mouth daily. 07/10/17   [provider]  loratadine (CLARITIN) 10 MG tablet Take 10 mg by mouth daily.    [provider]  losartan (COZAAR) 50 MG tablet Take 1 tablet (50 mg total) by mouth daily. 04/12/22   Patwardhan, [name], MD  metFORMIN (GLUCOPHAGE-XR) 500 MG 24 hr tablet Take 500 mg by mouth 2 (two) times daily. 07/17/17   [provider]  nitroGLYCERIN (NITROSTAT) 0.4 MG SL tablet Place 1 tablet (0.4 mg total) under the tongue every 5 (five) minutes as needed for chest pain. 08/24/17   [name], MD  oxyCODONE (OXY IR/ROXICODONE) 5 MG immediate release tablet Take 1 tablet (5 mg total) by mouth every 6 (six) hours as needed  for moderate pain. 05/15/22   [name], MD  pioglitazone (ACTOS) 30 MG tablet Take 30 mg by mouth daily. 07/17/17   [provider]  Semaglutide, 2 MG/DOSE, (OZEMPIC, 2 MG/DOSE,) 8 MG/3ML SOPN Inject 2 mg into the skin once a week. Sundays    [provider]  sodium chloride flush (NS) 0.9 % SOLN Inject 5-10 mls of saline into drain catheter daily. 05/28/22 06/27/22  Sheliah Plane, PA     Family History  Problem Relation Age of Onset   Stroke Mother    Macular degeneration Mother    Thyroid cancer  Mother    Heart disease Father    Heart disease Brother     Social History   Socioeconomic History   Marital status: Married    Spouse name: Not on file   Number of children: 1   Years of education: Not on file   Highest education level: Not on file  Occupational History   Not on file  Tobacco Use   Smoking status: Never   Smokeless tobacco: Never  Vaping Use   Vaping Use: Never used  Substance and Sexual Activity   Alcohol use: No   Drug use: No   Sexual activity: Not on file  Other Topics Concern   Not on file  Social History Narrative   Not on file   Social Determinants of Health   Financial Resource Strain: Not on file  Food Insecurity: Not on file  Transportation Needs: Not on file  Physical Activity: Not on file  Stress: Not on file  Social Connections: Not on file     Vital Signs: There were no vitals taken for this visit.  Physical Exam Skin:    General: Skin is warm.     Comments: Site is c/d/I No sign of infection No bleeding  Injection shows no communication to biliary ducts CT revealing resolution of collection per Dr Bonnita Levan removal without complication Dressing placed     Imaging: No results found.  Labs:  CBC: Recent Labs    05/26/22 1837 05/27/22 0415 05/28/22 0224 05/29/22 0124  WBC 10.4 8.0 10.2 9.7  HGB 12.0* 10.5* 10.3* 10.7*  HCT 36.6* 32.0* 31.7* 32.2*  PLT 574* 461* 461* 454*    COAGS: Recent Labs    05/27/22 0802  INR 1.1    BMP: Recent Labs    05/26/22 1837 05/27/22 0415 05/28/22 0224 05/29/22 0124  NA 141 142 135 139  K 4.0 3.7 3.9 3.6  CL 104 110 103 104  CO2 26 25 25 26   GLUCOSE 154* 112* 138* 178*  BUN 10 8 7 9   CALCIUM 9.7 8.7* 8.5* 8.9  CREATININE 0.91 0.75 0.85 1.06  GFRNONAA >60 >60 >60 >60    LIVER FUNCTION TESTS: Recent Labs    05/26/22 1837 05/27/22 0415 05/28/22 0224 05/29/22 0124  BILITOT 0.4 0.5 0.4 0.2*  AST 22 19 17  13*  ALT 30 24 23 19   ALKPHOS 92 75 79 73  PROT  6.8 5.2* 5.4* 5.3*  ALBUMIN 3.5 2.8* 2.7* 2.8*    Assessment:  GB fossa abscess CT today show resolution of collection Injection shows no communication to biliary ducts Drain removed Pt is to keep appt with Dr 05/30/22 next week He has good understanding of plan   Signed: 05/31/22, PA-C 06/09/2022, 1:11 PM   Please refer to Dr. attestation of this note for management and plan.

## 2022-07-11 ENCOUNTER — Other Ambulatory Visit: Payer: Self-pay | Admitting: Internal Medicine

## 2022-08-31 ENCOUNTER — Other Ambulatory Visit: Payer: Self-pay

## 2022-08-31 MED ORDER — REPATHA SURECLICK 140 MG/ML ~~LOC~~ SOAJ: 140.0000 mg | SUBCUTANEOUS | 3 refills | Status: DC

## 2022-12-26 ENCOUNTER — Other Ambulatory Visit: Payer: Self-pay

## 2022-12-26 MED ORDER — REPATHA SURECLICK 140 MG/ML ~~LOC~~ SOAJ: 140.0000 mg | SUBCUTANEOUS | 3 refills | Status: DC

## 2023-04-05 ENCOUNTER — Ambulatory Visit: Payer: BC Managed Care – PPO | Admitting: Cardiology

## 2023-04-05 ENCOUNTER — Encounter: Payer: Self-pay | Admitting: Cardiology

## 2023-04-05 VITALS — BP 139/74 | HR 49 | Resp 16 | Ht 70.0 in | Wt 242.0 lb

## 2023-04-05 DIAGNOSIS — I2510 Atherosclerotic heart disease of native coronary artery without angina pectoris: Secondary | ICD-10-CM

## 2023-04-05 DIAGNOSIS — E782 Mixed hyperlipidemia: Secondary | ICD-10-CM

## 2023-04-05 DIAGNOSIS — I10 Essential (primary) hypertension: Secondary | ICD-10-CM

## 2023-04-05 NOTE — Progress Notes (Signed)
Patient referred by Altheimer, Casimiro Needle, MD for coronary artery disease  Subjective:   Benjamin Riley, male    DOB: 01/03/1961, 62 y.o.   MRN: [id number]   Chief Complaint  Patient presents with   Coronary Artery Disease   Follow-up    1 year     HPI  62 y/o Caucasian male with hypertension, hyperlipidemia, controlled type 2 DM, CAD  Patient is doing well, denies chest pain, shortness of breath, palpitations, leg edema, orthopnea, PND, TIA/syncope. He had been out of Ozempic, but is taking it regularly now.     Current Outpatient Medications:    Ascorbic Acid (VITAMIN C) 1000 MG tablet, Take 1,000 mg by mouth daily., Disp: , Rfl:    aspirin EC 81 MG tablet, Take 81 mg by mouth daily., Disp: , Rfl:    Cholecalciferol (VITAMIN D3) 2000 units capsule, Take 2,000 Units by mouth daily., Disp: , Rfl:    Coenzyme Q10 (COQ10) 100 MG CAPS, Take 100 mg by mouth daily., Disp: , Rfl:    Cyanocobalamin 2000 MCG TBCR, Take 2,000 mcg by mouth daily., Disp: , Rfl:    Evolocumab (REPATHA SURECLICK) 140 MG/ML SOAJ, Inject 140 mg into the muscle every 14 (fourteen) days., Disp: 6 mL, Rfl: 3   ezetimibe (ZETIA) 10 MG tablet, Take 10 mg by mouth daily., Disp: , Rfl:    glipiZIDE (GLUCOTROL) 10 MG tablet, Take 10 mg by mouth daily before breakfast., Disp: , Rfl: 3   JARDIANCE 10 MG TABS tablet, Take 10 mg by mouth daily., Disp: , Rfl: 3   loratadine (CLARITIN) 10 MG tablet, Take 10 mg by mouth daily., Disp: , Rfl:    losartan (COZAAR) 50 MG tablet, Take 1 tablet (50 mg total) by mouth daily., Disp: 90 tablet, Rfl: 3   metFORMIN (GLUCOPHAGE-XR) 500 MG 24 hr tablet, Take 500 mg by mouth 2 (two) times daily., Disp: , Rfl: 3   nitroGLYCERIN (NITROSTAT) 0.4 MG SL tablet, Place 1 tablet (0.4 mg total) under the tongue every 5 (five) minutes as needed for chest pain., Disp: 25 tablet, Rfl: 12   oxyCODONE (OXY IR/ROXICODONE) 5 MG immediate release tablet, Take 1 tablet (5 mg total) by mouth every 6 (six)  hours as needed for moderate pain., Disp: 10 tablet, Rfl: 0   pioglitazone (ACTOS) 30 MG tablet, Take 30 mg by mouth daily., Disp: , Rfl: 2   Semaglutide, 2 MG/DOSE, (OZEMPIC, 2 MG/DOSE,) 8 MG/3ML SOPN, Inject 2 mg into the skin once a week. Sundays, Disp: , Rfl:   Cardiovascular studies:  EKG 4/24/4: Sinus rhythm 54 bpm LVH  Coronary angiogram 08/24/2017: LM: Normal LAD: Patent proximal LAD stent, moderate disease proximal-mid LAD, involving D1 and D2.  Severe stenosis in distal apical LAD LCx: Normal RCA: Diffuse mild to moderate disease.  Recent labs: 01/04/2023: Glucose 258, BUN/Cr 16/0.83. EGFR >90. Na/K 137/4.6. Rest of the CMP normal HbA1C 8.1% Chol 107, TG 162, HDL 45, LDL 35  05/29/2022: Glucose 178, BUN/Cr 9/1.06. EGFR >60. Na/K 139/3.6. Albumin 2.8. Total protein 5.3. Rest of the CMP normal H/H 10.7/32.2. MCV 96.7. Platelets 454 HbA1C 7.5%   12/23/2021: Glucose 132, BUN/Cr 15/0.82. EGFR >90. Na/K 138/4.5. Rest of the CMP normal HbA1C 7.6% Chol 99, TG 119, HDL 47, LDL 36 TSH 2.8 normal   Review of Systems  Cardiovascular:  Negative for chest pain, dyspnea on exertion, leg swelling, palpitations and syncope.        Vitals:   04/05/23 1507  BP: 139/74  Pulse: (!) 49  Resp: 16  SpO2: 98%     Objective:   Physical Exam Vitals and nursing note reviewed.  Constitutional:      Appearance: He is well-developed.  Neck:     Vascular: No JVD.  Cardiovascular:     Rate and Rhythm: Normal rate and regular rhythm.     Pulses: Intact distal pulses.     Heart sounds: Normal heart sounds. No murmur heard. Pulmonary:     Effort: Pulmonary effort is normal.     Breath sounds: Normal breath sounds. No wheezing or rales.  Musculoskeletal:     Right lower leg: No edema.     Left lower leg: No edema.    Visit diagnoses:    ICD-10-CM   1. Coronary artery disease involving native coronary artery of native heart without angina pectoris  I25.10 EKG 12-Lead    2.  Essential hypertension  I10     3. Mixed hyperlipidemia  E78.2          Assessment & Recommendations:   62 y/o Caucasian male with hypertension, hyperlipidemia, controlled type 2 DM, CAD  CAD: H/o LAD pCI Stable with no angina symptoms.  Continue aspirin 81 mg daily.  Continue Repatha, Zetia. LDL 35 (12/2022).  Hypertension: Controlled  Type 2 diabetes mellitus: HbA1C 8.1% (12/2022). Goal <7% Continue f/u w/PCP  F/u in 1 year    Elder Negus, MD Pager: [phone] Office: [phone]

## 2023-04-13 ENCOUNTER — Ambulatory Visit: Payer: BC Managed Care – PPO | Admitting: Cardiology

## 2023-08-09 ENCOUNTER — Encounter: Payer: Self-pay | Admitting: Cardiology

## 2023-10-13 ENCOUNTER — Other Ambulatory Visit (HOSPITAL_COMMUNITY): Payer: Self-pay

## 2023-10-13 ENCOUNTER — Telehealth: Payer: Self-pay | Admitting: Pharmacy Technician

## 2023-10-13 NOTE — Telephone Encounter (Signed)
Pharmacy Patient Advocate Encounter   Received notification from CoverMyMeds that prior authorization for repatha is required/requested.   Insurance verification completed.   The patient is insured through [facility] .   Per test claim: PA required; PA submitted to above mentioned insurance via CoverMyMeds Key/confirmation #/EOC [id number] Status is pending

## 2023-10-16 NOTE — Telephone Encounter (Signed)
Pharmacy Patient Advocate Encounter  Received notification from [facility] that Prior Authorization for repatha has been APPROVED from 10/16/23 to 10/15/24   PA #/Case ID/Reference #: [id number]

## 2023-12-29 IMAGING — CT CT IMAGE GUIDED DRAINAGE BY PERCUTANEOUS CATHETER
1 of 3 series · 11 of 32 positions shown, 17 images · non-contrast
Comparison: CT AP, 05/26/2022 and 05/11/2022.

INDICATION: Recent cholecystectomy with gallbladder fossa collection.

[Series 2: i-spiral 5.0 b40f · axial · 0.59mm/px · z∈[+56,+161]mm · 11 of 38 slices shown, 17 images]
[im 4/38  soft-tissue]
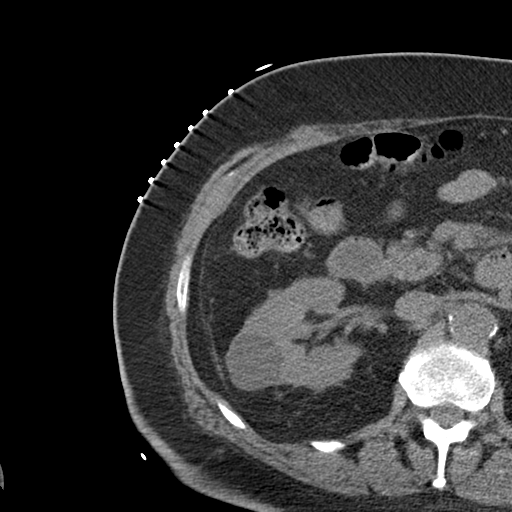
[im 4/38  bone]
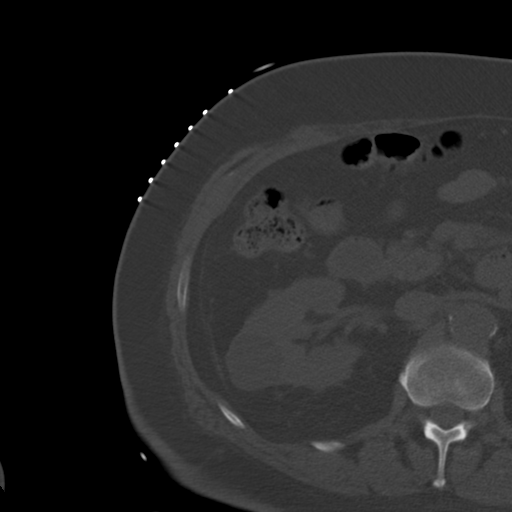
[im 7/38  soft-tissue]
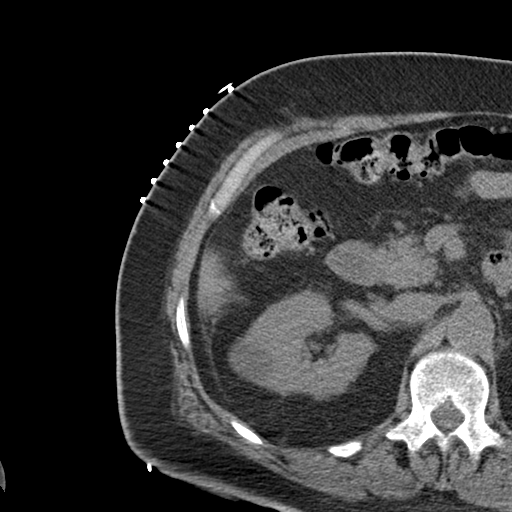
[im 10/38  soft-tissue]
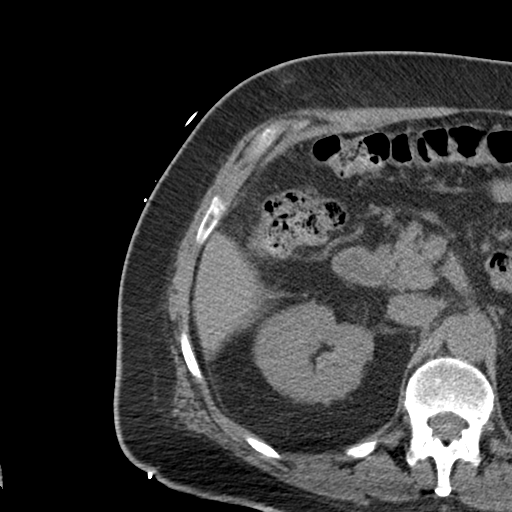
[im 13/38  soft-tissue]
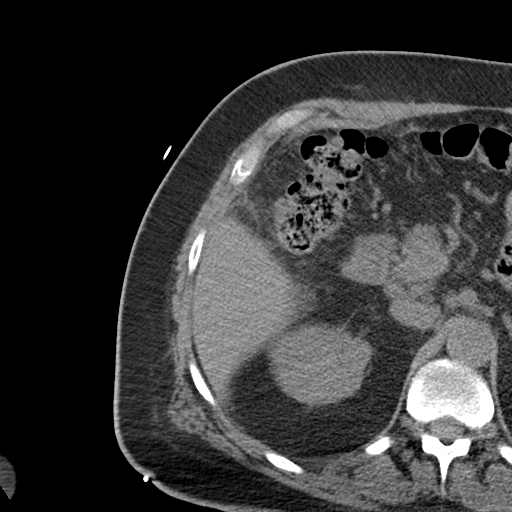
[im 16/38  soft-tissue]
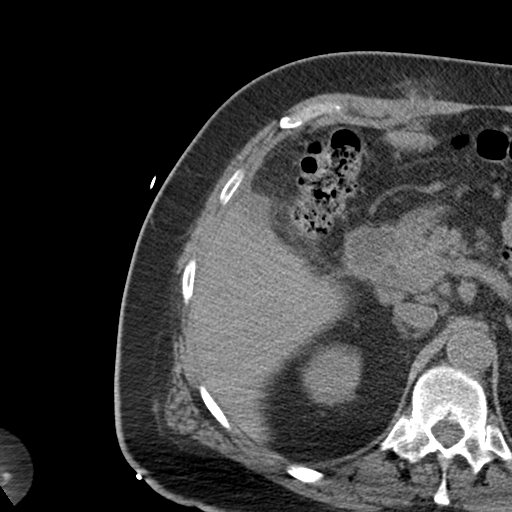
[im 19/38  soft-tissue]
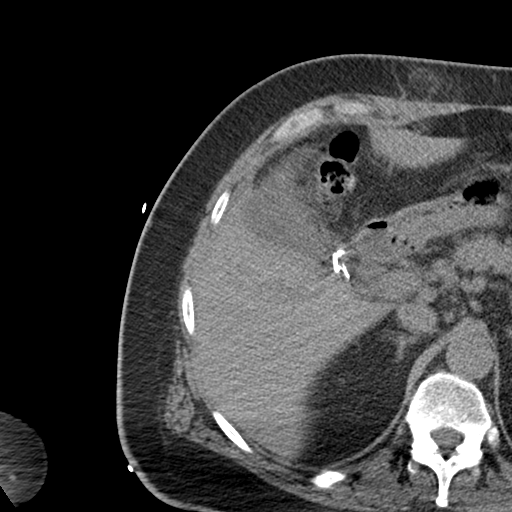
[im 22/38  soft-tissue]
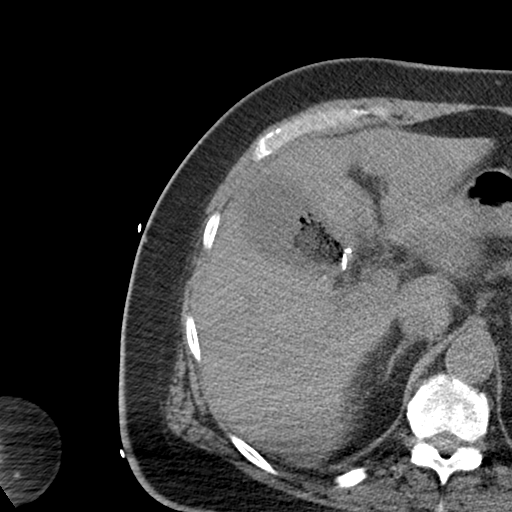
[im 25/38  soft-tissue]
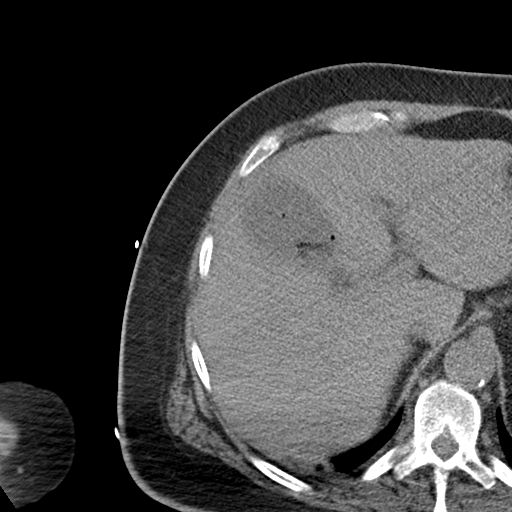
[im 25/38  lung]
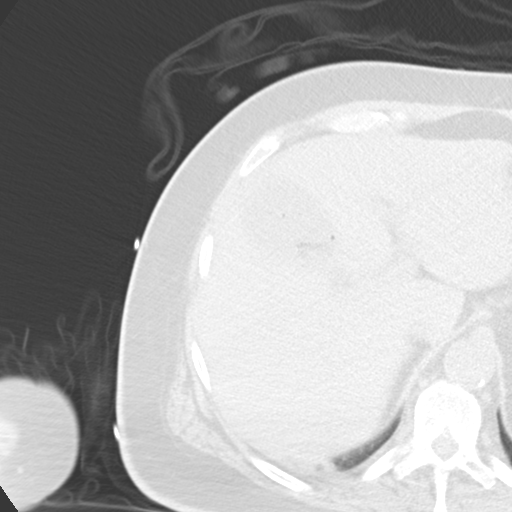
[im 28/38  soft-tissue]
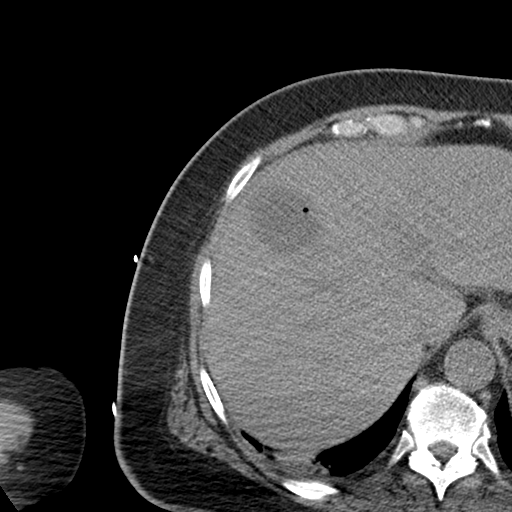
[im 28/38  lung]
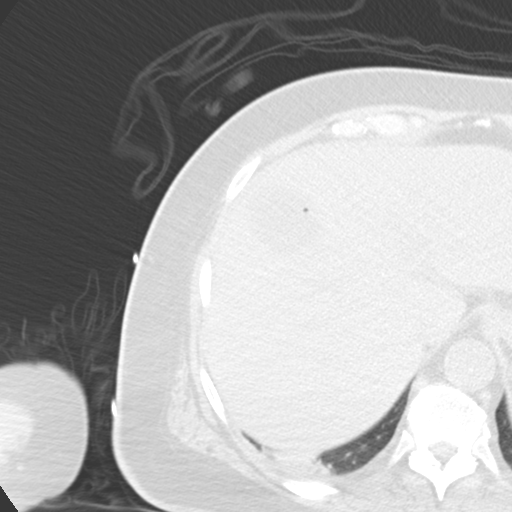
[im 28/38  bone]
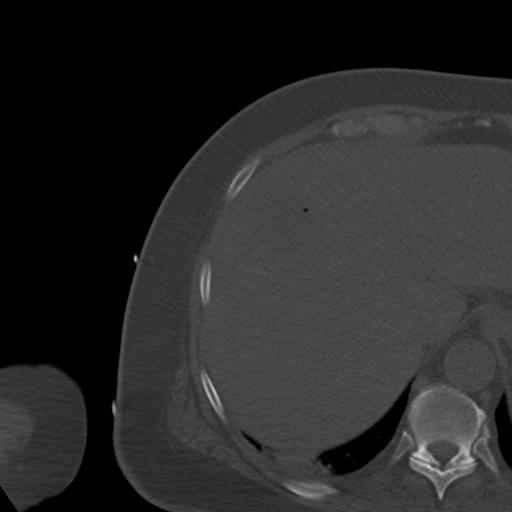
[im 31/38  soft-tissue]
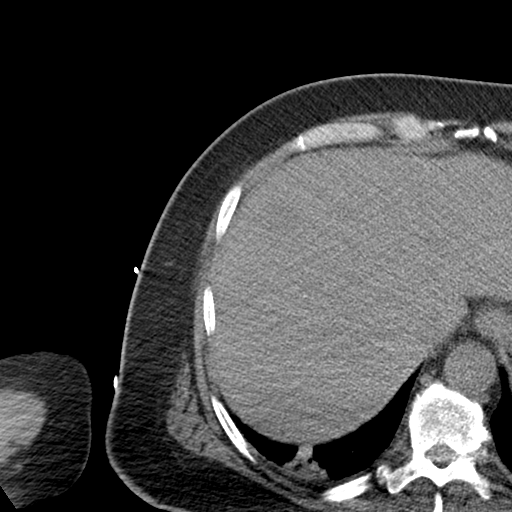
[im 31/38  lung]
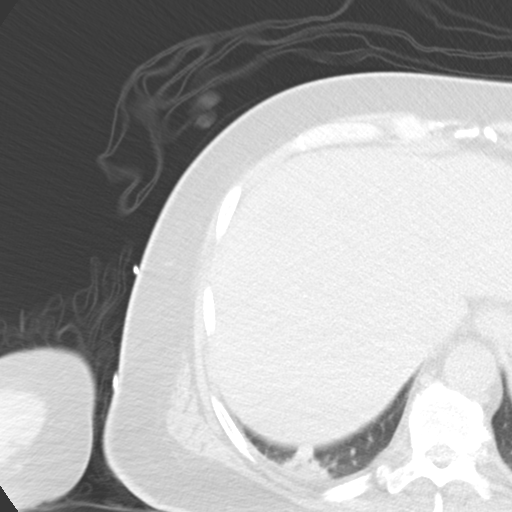
[im 34/38  soft-tissue]
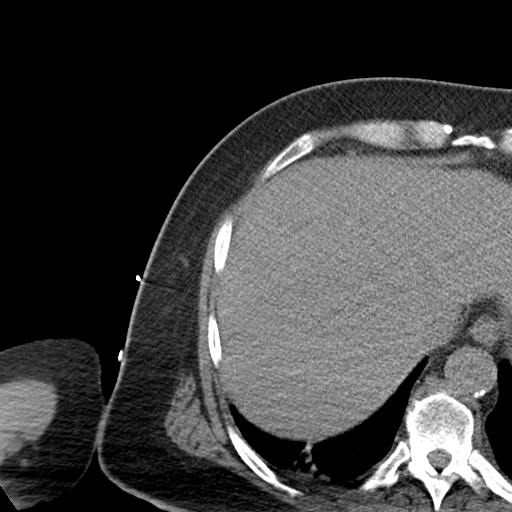
[im 34/38  lung]
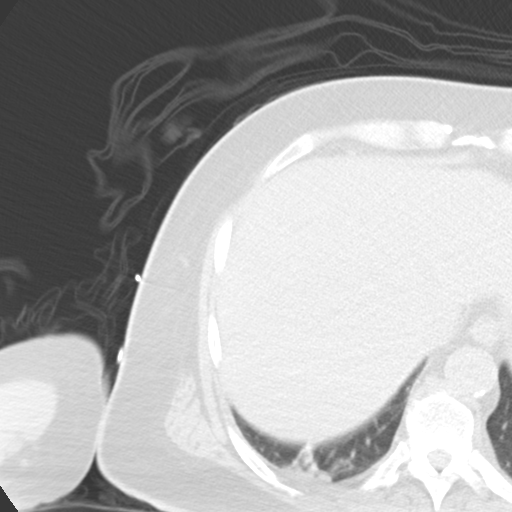

[11 of 32 positions shown; findings below may reference images not displayed]

EXAM:
CT GUIDED DRAINAGE CATHETER PLACEMENT INTO GALLBLADDER FOSSA
COLLECTION

RADIATION DOSE REDUCTION: This exam was performed according to the
departmental dose-optimization program which includes automated
exposure control, adjustment of the mA and/or kV according to
patient size and/or use of iterative reconstruction technique.
MEDICATIONS:
The patient is currently admitted to the hospital and receiving
intravenous antibiotics. The antibiotics were administered within an
appropriate time frame prior to the initiation of the procedure.

ANESTHESIA/SEDATION:
Moderate (conscious) sedation was employed during this procedure. A
total of Versed 2 mg and Fentanyl 100 mcg was administered
intravenously.

Moderate Sedation Time: 22 minutes. The patient's level of
consciousness and vital signs were monitored continuously by
radiology nursing throughout the procedure under my direct
supervision.

CONTRAST:  None

COMPLICATIONS:
None immediate.

PROCEDURE:
Informed written consent was obtained from the patient and/or
patient's representative after a discussion of the risks, benefits
and alternatives to treatment. The patient was placed supine on the
CT gantry and a pre procedural CT was performed re-demonstrating the
known abscess/fluid collection within the gallbladder fossa. The
procedure was planned. A timeout was performed prior to the
initiation of the procedure.

The RIGHT upper quadrant was prepped and draped in the usual sterile
fashion. The overlying soft tissues were anesthetized with 1%
lidocaine with epinephrine. Appropriate trajectory was planned with
the use of a 22 gauge spinal needle. An 18 gauge trocar needle was
advanced into the abscess/fluid collection and a short Amplatz super
stiff wire was coiled within the collection. Appropriate positioning
was confirmed with a limited CT scan. The tract was serially dilated
allowing placement of a 12 French all-purpose drainage catheter.
Appropriate positioning was confirmed with a limited postprocedural
CT scan.

5 mL of bilious fluid was aspirated and submitted for analysis. The
tube was connected to bulb suction and sutured in place. A dressing
was placed. The patient tolerated the procedure well without
immediate post procedural complication.
IMPRESSION: Successful CT guided placement of a 12 Fr drain catheter into the
gallbladder fossa with aspiration of bilious fluid, as above.

Samples were sent to the laboratory as requested by the ordering
clinical team.

## 2024-04-04 ENCOUNTER — Ambulatory Visit: Payer: Self-pay | Admitting: Cardiology

## 2024-05-10 ENCOUNTER — Ambulatory Visit: Attending: Cardiology | Admitting: Cardiology

## 2024-05-10 ENCOUNTER — Encounter: Payer: Self-pay | Admitting: Cardiology

## 2024-05-10 VITALS — BP 122/80 | HR 57 | Resp 16 | Ht 70.0 in | Wt 220.4 lb

## 2024-05-10 DIAGNOSIS — E782 Mixed hyperlipidemia: Secondary | ICD-10-CM

## 2024-05-10 DIAGNOSIS — I10 Essential (primary) hypertension: Secondary | ICD-10-CM | POA: Diagnosis not present

## 2024-05-10 DIAGNOSIS — Z794 Long term (current) use of insulin: Secondary | ICD-10-CM

## 2024-05-10 DIAGNOSIS — I2510 Atherosclerotic heart disease of native coronary artery without angina pectoris: Secondary | ICD-10-CM

## 2024-05-10 DIAGNOSIS — E119 Type 2 diabetes mellitus without complications: Secondary | ICD-10-CM | POA: Diagnosis not present

## 2024-05-10 MED ORDER — REPATHA SURECLICK 140 MG/ML ~~LOC~~ SOAJ: 140.0000 mg | SUBCUTANEOUS | 3 refills | Status: DC

## 2024-05-10 MED ORDER — LOSARTAN POTASSIUM 50 MG PO TABS: 50.0000 mg | ORAL_TABLET | Freq: Every day | ORAL | 3 refills | Status: AC

## 2024-05-10 MED ORDER — ASPIRIN EC 81 MG PO TBEC: 81.0000 mg | DELAYED_RELEASE_TABLET | Freq: Every day | ORAL | Status: AC

## 2024-05-10 MED ORDER — EZETIMIBE 10 MG PO TABS: 10.0000 mg | ORAL_TABLET | Freq: Every day | ORAL | 3 refills | Status: AC

## 2024-05-10 MED ORDER — METFORMIN HCL ER 500 MG PO TB24
500.0000 mg | ORAL_TABLET | Freq: Two times a day (BID) | ORAL | 3 refills | Status: AC
Start: 2024-05-10 — End: ?

## 2024-05-10 NOTE — Progress Notes (Signed)
 Cardiology Office Note:  .   Date:  05/10/2024  ID:  Benjamin Riley, DOB 15-Nov-1961, MRN [id number] PCP: Edmon Gosling, MD  [facility] Providers Cardiologist:  Fransico Ivy, MD PCP: Edmon Gosling, MD  Chief Complaint  Patient presents with   Coronary artery disease involving native coronary artery of   Follow-up     Benjamin Riley is a 63 y.o. male with hypertension, hyperlipidemia, controlled type 2 DM, CAD   History of Present Illness  Patient is doing well.  He walks 2 miles every day without any complains of chest pain or shortness of breath.  Blood pressure is well-controlled.  He has been out of Repatha  for 6 months due to certain insurance issues.  However, it does appear that his insurance did approve Repatha  back in November 2024.  At this time, patient does not have a PCP.  He does have diabetes patient is Dr. Hildy Lowers that he sees regularly.  Diabetes is improving.     Vitals:   05/10/24 0800  BP: 122/80  Pulse: (!) 57  Resp: 16  SpO2: 96%      Review of Systems  Cardiovascular:  Negative for chest pain, dyspnea on exertion, leg swelling, palpitations and syncope.        Studies Reviewed: Aaron Aas        EKG 05/10/2024: Sinus bradycardia Moderate voltage criteria for LVH, may be normal variant ( R in aVL , Cornell product ) When compared with ECG of 11-May-2022 13:33, No significant change was found Confirmed by Fransico Ivy [phone]) on 05/10/2024 8:04:37 AM    Independently interpreted 12/2023: Chol 167, TG 157, HDL 53, LDL 89 HbA1C 6.6% Hb 13.6 Cr 0.78 TSH 2.6    Physical Exam Vitals and nursing note reviewed.  Constitutional:      General: He is not in acute distress. Neck:     Vascular: No JVD.  Cardiovascular:     Rate and Rhythm: Normal rate and regular rhythm.     Heart sounds: Normal heart sounds. No murmur heard. Pulmonary:     Effort: Pulmonary effort is normal.     Breath sounds: Normal breath sounds. No  wheezing or rales.  Musculoskeletal:     Right lower leg: No edema.     Left lower leg: No edema.      VISIT DIAGNOSES:   ICD-10-CM   1. Coronary artery disease involving native coronary artery of native heart without angina pectoris  I25.10 EKG 12-Lead       Benjamin Riley is a 63 y.o. male with hypertension, hyperlipidemia, controlled type 2 DM, CAD   Assessment & Plan  CAD: H/o LAD pCI (2018). Stable with no angina symptoms.  Continue aspirin  81 mg daily.  H/o statin myalgias with various statins, therefore not taking statin currently. Continue Zetia . Refilled Repatha , approved by insurance in 10/2023. Check lipid panel in 6 months.  Hypertension: Controlled on Losartan .  Mixed hyperlipidemia: As above   Type 2 diabetes mellitus: HbA1C 6.6% Continue f/u w/Dr. Hildy Lowers  I have encouraged the patient to establish care with PCP for preventative care.   Meds ordered this encounter  Medications   aspirin  EC 81 MG tablet    Sig: Take 1 tablet (81 mg total) by mouth daily.   ezetimibe  (ZETIA ) 10 MG tablet    Sig: Take 1 tablet (10 mg total) by mouth daily.    Dispense:  90 tablet    Refill:  3   losartan  (COZAAR ) 50 MG tablet  Sig: Take 1 tablet (50 mg total) by mouth daily.    Dispense:  90 tablet    Refill:  3   metFORMIN (GLUCOPHAGE-XR) 500 MG 24 hr tablet    Sig: Take 1 tablet (500 mg total) by mouth 2 (two) times daily.    Dispense:  180 tablet    Refill:  3   Evolocumab  (REPATHA  SURECLICK) 140 MG/ML SOAJ    Sig: Inject 140 mg into the muscle every 14 (fourteen) days.    Dispense:  6 mL    Refill:  3    approved through 06/22/2023     F/u in 1 year  Signed, Cody Das, MD

## 2024-05-10 NOTE — Patient Instructions (Signed)
 Medication Instructions:  Refills sent in today   *If you need a refill on your cardiac medications before your next appointment, please call your pharmacy*  Lab Work: Lipid panel in 6 months   If you have labs (blood work) drawn today and your tests are completely normal, you will receive your results only by: MyChart Message (if you have MyChart) OR A paper copy in the mail If you have any lab test that is abnormal or we need to change your treatment, we will call you to review the results.   Follow-Up: At [facility], you and your health needs are our priority.  As part of our continuing mission to provide you with exceptional heart care, our providers are all part of one team.  This team includes your primary Cardiologist (physician) and Advanced Practice Providers or APPs (Physician Assistants and Nurse Practitioners) who all work together to provide you with the care you need, when you need it.  Your next appointment:   1 year(s)  Provider:   Cody Das, MD

## 2024-05-16 ENCOUNTER — Telehealth: Payer: Self-pay | Admitting: Pharmacy Technician

## 2024-05-16 ENCOUNTER — Telehealth: Payer: Self-pay | Admitting: Cardiology

## 2024-05-16 ENCOUNTER — Other Ambulatory Visit (HOSPITAL_COMMUNITY): Payer: Self-pay

## 2024-05-16 NOTE — Telephone Encounter (Signed)
 Pharmacy Patient Advocate Encounter   Received notification from Pt Calls Messages that prior authorization for REPATHA  140MG  is required/requested.   Insurance verification completed.   The patient is insured through [facility] .   Per test claim: PA required; PA submitted to above mentioned insurance via CoverMyMeds Key/confirmation #/EOC [id number] Status is pending

## 2024-05-16 NOTE — Telephone Encounter (Signed)
 Pt c/o medication issue:  1. Name of Medication: Evolocumab  (REPATHA  SURECLICK) 140 MG/ML SOAJ   2. How are you currently taking this medication (dosage and times per day)?   3. Are you having a reaction (difficulty breathing--STAT)?   4. What is your medication issue? Patient called stating he got an email from his insurance company stating they didn't approve this medication.  He would like for us  to work on this.

## 2024-05-17 ENCOUNTER — Other Ambulatory Visit (HOSPITAL_COMMUNITY): Payer: Self-pay

## 2024-05-17 NOTE — Telephone Encounter (Signed)
 Pharmacy Patient Advocate Encounter  Received notification from [facility] that Prior Authorization for Repatha  has been APPROVED from 05/16/24 to 05/16/25. Ran test claim, Copay is $90.00- 3 months. This test claim was processed through [facility]- copay amounts may vary at other pharmacies due to pharmacy/plan contracts, or as the patient moves through the different stages of their insurance plan.   PA #/Case ID/Reference #: [id number]

## 2024-06-17 ENCOUNTER — Telehealth: Payer: Self-pay | Admitting: Cardiology

## 2024-06-17 MED ORDER — REPATHA SURECLICK 140 MG/ML ~~LOC~~ SOAJ: 140.0000 mg | SUBCUTANEOUS | 3 refills | Status: AC

## 2024-06-17 NOTE — Telephone Encounter (Signed)
 *  STAT* If patient is at the pharmacy, call can be transferred to refill team.   1. Which medications need to be refilled? (please list name of each medication and dose if known)   Evolocumab  (REPATHA  SURECLICK) 140 MG/ML SOAJ     2. Would you like to learn more about the convenience, safety, & potential cost savings by using the [facility]? No      3. Are you open to using the [facility] (Type [facility]. ). No    4. Which pharmacy/location (including street and city if local pharmacy) is medication to be sent to? [facility] [id number] - [place], [place] - [address]     5. Do they need a 30 day or 90 day supply? 30 days   Pt requested to send prescription to his local pharmacy. Also, pt needs it today.
